# Patient Record
Sex: Female | Born: 1995 | Race: Black or African American | Hispanic: No | Marital: Single | Smoking: Former smoker
Health system: Southern US, Community
[De-identification: ages and names within clinical notes are randomized; demographics above are authoritative.]

## PROBLEM LIST (undated history)

## (undated) ENCOUNTER — Inpatient Hospital Stay (HOSPITAL_COMMUNITY): Payer: Self-pay

## (undated) DIAGNOSIS — K219 Gastro-esophageal reflux disease without esophagitis: Secondary | ICD-10-CM

## (undated) DIAGNOSIS — K819 Cholecystitis, unspecified: Secondary | ICD-10-CM

## (undated) DIAGNOSIS — Z789 Other specified health status: Secondary | ICD-10-CM

## (undated) DIAGNOSIS — G43909 Migraine, unspecified, not intractable, without status migrainosus: Secondary | ICD-10-CM

## (undated) HISTORY — DX: Migraine, unspecified, not intractable, without status migrainosus: G43.909

## (undated) HISTORY — PX: NO PAST SURGERIES: SHX2092

## (undated) HISTORY — DX: Cholecystitis, unspecified: K81.9

## (undated) HISTORY — PX: MOUTH SURGERY: SHX715

## (undated) HISTORY — PX: LAPAROSCOPIC GASTRIC BAND REMOVAL WITH LAPAROSCOPIC GASTRIC SLEEVE RESECTION: SHX6498

---
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
## 2014-05-04 ENCOUNTER — Emergency Department (HOSPITAL_COMMUNITY)
Admission: EM | Admit: 2014-05-04 | Discharge: 2014-05-04 | Disposition: A | Discharge status: Home / Self Care | Payer: 59 | Attending: Emergency Medicine | Admitting: Emergency Medicine

## 2014-05-04 ENCOUNTER — Encounter (HOSPITAL_COMMUNITY): Payer: Self-pay | Admitting: Emergency Medicine

## 2014-05-04 DIAGNOSIS — Z792 Long term (current) use of antibiotics: Secondary | ICD-10-CM | POA: Insufficient documentation

## 2014-05-04 DIAGNOSIS — J020 Streptococcal pharyngitis: Secondary | ICD-10-CM | POA: Insufficient documentation

## 2014-05-04 DIAGNOSIS — J0300 Acute streptococcal tonsillitis, unspecified: Secondary | ICD-10-CM

## 2014-05-04 DIAGNOSIS — H9209 Otalgia, unspecified ear: Secondary | ICD-10-CM | POA: Insufficient documentation

## 2014-05-04 DIAGNOSIS — Z3202 Encounter for pregnancy test, result negative: Secondary | ICD-10-CM | POA: Insufficient documentation

## 2014-05-04 LAB — POC URINE PREG, ED: PREG TEST UR: NEGATIVE

## 2014-05-04 LAB — RAPID STREP SCREEN (MED CTR MEBANE ONLY): STREPTOCOCCUS, GROUP A SCREEN (DIRECT): POSITIVE — AB

## 2014-05-04 MED ORDER — CLINDAMYCIN HCL 150 MG PO CAPS
450.0000 mg | ORAL_CAPSULE | Freq: Once | ORAL | Status: AC
  Administered 2014-05-04: 450 mg via ORAL
  Filled 2014-05-04: qty 3

## 2014-05-04 MED ORDER — ACETAMINOPHEN 325 MG PO TABS
650.0000 mg | ORAL_TABLET | Freq: Once | ORAL | Status: AC
Start: 2014-05-04 — End: 2014-05-04
  Administered 2014-05-04: 650 mg via ORAL
  Filled 2014-05-04: qty 2

## 2014-05-04 MED ORDER — CLINDAMYCIN HCL 150 MG PO CAPS: 450.0000 mg | ORAL_CAPSULE | Freq: Three times a day (TID) | ORAL | Status: DC

## 2014-05-04 NOTE — ED Provider Notes (Signed)
CSN: [id number]     Arrival date & time 05/04/14  [id number] History  This chart was scribed for non-physician practitioner, Raymon MuttonMarissa Veronica Fretz, PA-C,working with Gilda Creasehristopher J. Pollina, MD, by Karle PlumberJennifer Tensley, ED Scribe.  This patient was seen in room TR06C/TR06C and the patient's care was started at 11:23 AM.  Chief Complaint  Patient presents with  . Oral Swelling   The history is provided by the patient. No language interpreter was used.   HPI Comments:  [name] [name] is a 18 y.o. female who presents to the Emergency Department complaining of worsening sore throat with painful swallowing and bilateral tonsillar region pain onset yesterday. She states her throat started out just feeling dry but now feels as if she has something "stuck in it". She reports associated bilateral ear pain with the left being worse. She states she took Advil last night for the pain. She denies sick contacts. Pt denies neck stiffness, neck pain, cough, SOB, CP, fever, chills, hemoptysis, nausea, vomiting, abdominal pain, dental pain, facial swelling, or ear drainage. She is allergic to shellfish but is unsure if it is an allergy to iodine.   History reviewed. No pertinent past medical history. History reviewed. No pertinent past surgical history. History reviewed. No pertinent family history. History  Substance Use Topics  . Smoking status: Never Smoker   . Smokeless tobacco: Never Used  . Alcohol Use: No   OB History   Grav Para Term Preterm Abortions TAB SAB Ect Mult Living                 Review of Systems  Constitutional: Negative for fever and chills.  HENT: Positive for ear pain and sore throat. Negative for dental problem, facial swelling and trouble swallowing.   Respiratory: Negative for cough and shortness of breath.   Cardiovascular: Negative for chest pain.  Gastrointestinal: Negative for nausea, vomiting and abdominal pain.  Musculoskeletal: Negative for neck stiffness.  Allergic/Immunologic:  Positive for food allergies (shellfish).    Allergies  Shellfish allergy  Home Medications   Prior to Admission medications   Medication Sig Start Date End Date Taking? Authorizing Provider  clindamycin (CLEOCIN) 150 MG capsule Take 3 capsules (450 mg total) by mouth 3 (three) times daily. 05/04/14   Anberlin Diez, PA-C   Triage Vitals: BP 126/77  Pulse 105  Temp(Src) 98.7 F (37.1 C) (Oral)  Resp 18  SpO2 97%  LMP 04/04/2014 Physical Exam  Nursing note and vitals reviewed. Constitutional: She is oriented to person, place, and time. She appears well-developed and well-nourished.  HENT:  Head: Normocephalic and atraumatic.  Mouth/Throat: Oropharyngeal exudate present.  Negative facial swelling. Negative swelling to the neck or deformities. Uvula midline with symmetrical elevation. Negative uvula swelling. Bilateral tonsillar adenopathy identified with exudate. Negative petechiae noted to the soft palate. Negative damage noted to dentition. Negative sublingual lesions. Negative trismus.  Eyes: Conjunctivae and EOM are normal. Pupils are equal, round, and reactive to light. Right eye exhibits no discharge. Left eye exhibits no discharge.  Neck: Normal range of motion. Neck supple. No tracheal deviation present.  Negative nuchal rigidity Negative neck stiffness Negative pain upon palpation to the C-spine Negative swelling noted to the neck or deformities Bilateral tonsillar adenopathy identified-soft, mobile  Cardiovascular: Normal rate, regular rhythm and normal heart sounds.  Exam reveals no friction rub.   No murmur heard. Pulmonary/Chest: Effort normal and breath sounds normal. No respiratory distress. She has no wheezes. She has no rales.  Patient is able to speak  in full sentences without difficulty Negative use of accessory muscles Negative stridor Negative active drooling   Musculoskeletal: Normal range of motion. She exhibits no edema and no tenderness.  Full ROM to  upper and lower extremities without difficulty noted, negative ataxia noted.  Lymphadenopathy:    She has cervical adenopathy.  Neurological: She is alert and oriented to person, place, and time. No cranial nerve deficit. She exhibits normal muscle tone. Coordination normal.  Cranial nerves III-XII grossly intact Equal grip strength bilaterally Negative facial drooping Negative slurred speech Negative aphasia Negative arm drift Fine motor skills intact Heel to knee down shin normal bilaterally Gait proper, proper balance - negative sway, negative drift, negative step-offs  Skin: Skin is warm and dry.  Psychiatric: She has a normal mood and affect. Her behavior is normal.    ED Course  Procedures (including critical care time) DIAGNOSTIC STUDIES: Oxygen Saturation is 97% on RA, normal by my interpretation.   COORDINATION OF CARE: 11:30 AM- Will order rapid strep test. Pt verbalizes understanding and agrees to plan.  Medications  clindamycin (CLEOCIN) capsule 450 mg (450 mg Oral Given 05/04/14 1223)  acetaminophen (TYLENOL) tablet 650 mg (650 mg Oral Given 05/04/14 1227)    Labs Review Labs Reviewed  RAPID STREP SCREEN - Abnormal; Notable for the following:    Streptococcus, Group A Screen (Direct) POSITIVE (*)    All other components within normal limits  POC URINE PREG, ED    Imaging Review No results found.   EKG Interpretation None      MDM   Final diagnoses:  Streptococcal tonsillitis    Medications  clindamycin (CLEOCIN) capsule 450 mg (450 mg Oral Given 05/04/14 1223)  acetaminophen (TYLENOL) tablet 650 mg (650 mg Oral Given 05/04/14 1227)    Filed Vitals:   05/04/14 1010 05/04/14 1300  BP: 126/77 110/58  Pulse: 105 88  Temp: 98.7 F (37.1 C)   TempSrc: Oral   Resp: 18 15  SpO2: 97% 100%    I personally performed the services described in this documentation, which was scribed in my presence. The recorded information has been reviewed and is  accurate.  Urine pregnancy negative. Rapid strep positive. Doubt peritonsillar abscess. Doubt retropharyngeal abscess. Suspicion to be staphylococcal pharyngitis/tonsillitis. Patient given clindamycin while in ED setting. Patient stable, afebrile. Patient not septic appearing. Patient able to tolerate fluids by mouth. Discharged patient. Discharged patient with antibiotics. Discussed with patient to rest and stay hydrated. [facility] and [facility]. Discussed with patient to closely monitor symptoms and if symptoms are to worsen or change to report back to the ED - strict return instructions given.  Patient agreed to plan of care, understood, all questions answered.   Raymon Mutton, PA-C 05/04/14 1837  Raymon Mutton, PA-C 05/05/14 [phone]

## 2014-05-04 NOTE — ED Notes (Signed)
Patient states throat feels swollen since yesterday morning. Denies short of breath

## 2014-05-04 NOTE — Discharge Instructions (Signed)
Please call and set up an appointment with your primary care provider as well as ear, nose, throat physician Please take antibiotics as prescribed-while on antibiotics please take on a full stomach Please avoid any physical or strenuous activity Please continue to monitor symptoms closely and if symptoms are to worsen or change (fever greater than 101, chills, chest pain, shortness of breath, difficulty breathing, neck pain, neck stiffness, difficulty swallowing, nausea, vomiting, stomach pain, worsening changes to pain symptoms) please report back to the ED immediately  Tonsillitis Tonsillitis is an infection of the throat that causes the tonsils to become red, tender, and swollen. Tonsils are collections of lymphoid tissue at the back of the throat. Each tonsil has crevices (crypts). Tonsils help fight nose and throat infections and keep infection from spreading to other parts of the body for the first 18 months of life.  CAUSES Sudden (acute) tonsillitis is usually caused by infection with streptococcal bacteria. Long-lasting (chronic) tonsillitis occurs when the crypts of the tonsils become filled with pieces of food and bacteria, which makes it easy for the tonsils to become repeatedly infected. SYMPTOMS  Symptoms of tonsillitis include:  A sore throat, with possible difficulty swallowing.  White patches on the tonsils.  Fever.  Tiredness.  New episodes of snoring during sleep, when you did not snore before.  Small, foul-smelling, yellowish-white pieces of material (tonsilloliths) that you occasionally cough up or spit out. The tonsilloliths can also cause you to have bad breath. DIAGNOSIS Tonsillitis can be diagnosed through a physical exam. Diagnosis can be confirmed with the results of lab tests, including a throat culture. TREATMENT  The goals of tonsillitis treatment include the reduction of the severity and duration of symptoms and prevention of associated conditions. Symptoms of  tonsillitis can be improved with the use of steroids to reduce the swelling. Tonsillitis caused by bacteria can be treated with antibiotic medicines. Usually, treatment with antibiotic medicines is started before the cause of the tonsillitis is known. However, if it is determined that the cause is not bacterial, antibiotic medicines will not treat the tonsillitis. If attacks of tonsillitis are severe and frequent, your health care provider may recommend surgery to remove the tonsils (tonsillectomy). HOME CARE INSTRUCTIONS   Rest as much as possible and get plenty of sleep.  Drink plenty of fluids. While the throat is very sore, eat soft foods or liquids, such as sherbet, soups, or instant breakfast drinks.  Eat frozen ice pops.  Gargle with a warm or cold liquid to help soothe the throat. Mix 1/4 teaspoon of salt and 1/4 teaspoon of baking soda in in 8 oz of water. SEEK MEDICAL CARE IF:   Large, tender lumps develop in your neck.  A rash develops.  A green, yellow-brown, or bloody substance is coughed up.  You are unable to swallow liquids or food for 24 hours.  You notice that only one of the tonsils is swollen. SEEK IMMEDIATE MEDICAL CARE IF:   You develop any new symptoms such as vomiting, severe headache, stiff neck, chest pain, or trouble breathing or swallowing.  You have severe throat pain along with drooling or voice changes.  You have severe pain, unrelieved with recommended medications.  You are unable to fully open the mouth.  You develop redness, swelling, or severe pain anywhere in the neck.  You have a fever. MAKE SURE YOU:   Understand these instructions.  Will watch your condition.  Will get help right away if you are not doing well or  get worse. Document Released: 07/22/2005 Document Revised: 10/17/2013 Document Reviewed: 03/31/2013 [facility] Patient Information 2015 [place], [place]. This information is not intended to replace advice given to you by your  health care provider. Make sure you discuss any questions you have with your health care provider.   Emergency Department Resource Guide 1) Find a Doctor and Pay Out of Pocket Although you won't have to find out who is covered by your insurance plan, it is a good idea to ask around and get recommendations. You will then need to call the office and see if the doctor you have chosen will accept you as a new patient and what types of options they offer for patients who are self-pay. Some doctors offer discounts or will set up payment plans for their patients who do not have insurance, but you will need to ask so you aren't surprised when you get to your appointment.  2) Contact Your Local Health Department Not all health departments have doctors that can see patients for sick visits, but many do, so it is worth a call to see if yours does. If you don't know where your local health department is, you can check in your phone book. The CDC also has a tool to help you locate your state's health department, and many state websites also have listings of all of their local health departments.  3) Find a Walk-in Clinic If your illness is not likely to be very severe or complicated, you may want to try a walk in clinic. These are popping up all over the country in pharmacies, drugstores, and shopping centers. They're usually staffed by nurse practitioners or physician assistants that have been trained to treat common illnesses and complaints. They're usually fairly quick and inexpensive. However, if you have serious medical issues or chronic medical problems, these are probably not your best option.  No Primary Care Doctor: - Call Health Connect at  [phone] - they can help you locate a primary care doctor that  accepts your insurance, provides certain services, etc. - Physician Referral Service- [phone]  Chronic Pain Problems: Organization         Address  Phone   Notes  [facility]  [phone] Patients need to be referred by their primary care doctor.   Medication Assistance: Organization         Address  Phone   Notes  [facility] Medication [facility] [address] [place] [place], [place] [address] [phone] --Must be a resident of [facility] -- Must have NO insurance coverage whatsoever (no Medicaid/ Medicare, etc.) -- The pt. MUST have a primary care doctor that directs their care regularly and follows them in the community   [facility]  [phone]   [employer]  [phone]    Agencies that provide inexpensive medical care: Organization         Address  Phone   Notes  [facility]  [phone]   [facility]    [phone]   [facility] [address] [place], [place] [address] [phone]   [facility] [address] [place] [place] [phone]   Planned Parenthood    [phone]   [facility]    [phone]   [facility]  [address], [place] Phone:  [phone], Fax:  [phone] Hours of Operation:  9 am - 6  pm, M-F.  Also accepts Medicaid/Medicare and self-pay.  [facility]  [address], [place] Phone: [phone], Fax: [phone]. Hours of Operation:  8:30 am - 5:30 pm, M-F.  Also accepts Medicaid and self-pay.  [facility] [place] [address], [place] Phone: [phone]   [facility] [address] Natasha Bence [place], [place] [phone], Ext. [phone] Mondays & Thursdays: 7-9 AM.  First 15 patients are seen on a first come, first serve basis.    Medicaid-accepting [facility] Providers:  Organization         Address  Phone   Notes  [facility] [address], [place] [phone] Also accepts self-pay patients.  [facility] [address] Laurell Josephs [place],  [place]  [phone]   [facility] [address], [place] [phone]   [facility] [address], [place] [phone]   Renaye Rakers [address], [place]   [phone] Only accepts [place] patients after they have their name applied to their card.   Self-Pay (no insurance) in [facility]:  Organization         Address  Phone   Notes  Sickle Cell Patients, [facility] [address] [place], [place] [phone]   [facility] [address] [place], [place] [phone]   [facility] [place]  [address], [place] [phone]   [facility]/Dr. Osei-Bonsu  [address] [place] or [address], [place] [phone] Phone number for both [place] and [place] locations is the same.  [facility] [address], [place] [phone]   [facility] [address] [place] or [address] [phone]   [facility] [address] [place] [phone], phone; [phone], fax Sees patients 1st and 3rd Saturday of every month.  Must not qualify for public or private insurance (i.e. Medicaid, Medicare, [place] Health Choice, Veterans' Benefits)  Household income should be no more than 200% of the poverty level The clinic cannot treat you if you are pregnant or think you are pregnant  Sexually transmitted diseases are not treated at the clinic.    Dental Care: Organization         Address  Phone  Notes  [facility] Department of [facility] [address] [place], [place] [phone] Accepts children up to age 56 who are enrolled in [place] or [place] Health Choice; pregnant women with a Medicaid card; and children who have applied for Medicaid or [place] Health  Choice, but were declined, whose parents can pay a reduced fee at time of service.  [facility] Department of [facility]  [address], [place] [phone] Accepts children up to age 57 who are enrolled in [place] or [place] Health Choice; pregnant women with a Medicaid card; and children who have applied for Medicaid or [place] Health Choice, but were declined, whose parents can pay a reduced fee at time of service.  [facility]  [address] [place], [place] [phone] Patients are seen by appointment only. Walk-ins are not accepted. [facility] will see patients 55 years of age and older.  Monday - Tuesday (8am-5pm) Most Wednesdays (8:30-5pm) $30 per visit, cash only  [facility]  [address], [facility] [place] [phone] Patients are seen by appointment only. Walk-ins are not accepted. [facility] will see patients 20 years of age and older. One Wednesday Evening (Monthly: Volunteer Based).  $30 per visit, cash only  [facility]  [phone] for adults; Children under age 584, call Graduate Pediatric Dentistry at [phone]. Children aged 644-14, please call [phone] to request a pediatric application.  Dental services are provided in all areas of dental care including fillings, crowns and bridges, complete and partial dentures, implants, gum treatment, root canals, and extractions. Preventive care is also provided. Treatment is provided to both adults and children. Patients are selected via a lottery and there is often a waiting list.   [facility] [address], [place]  [phone] [email or web address]   [facility] [address], [place], [place] [phone], Ext. [phone] Second and Fourth Thursday of each month, opens at 6:30 AM; Clinic ends at 9 AM.  Patients are seen on a first-come first-served basis, and a limited number are seen during each  clinic.   [facility]  [address], [place], [place] [phone]   Eligibility Requirements You must have lived in [place], [place], or [place] counties for at least the last three months.   You cannot be eligible for state or federal sponsored National Cityhealthcare insurance, including CIGNAVeterans Administration, [place]icaid, or [employer]icare.   You generally cannot be eligible for healthcare insurance through your employer.    How to apply: Eligibility screenings are held every Tuesday and Wednesday afternoon from 1:00 pm until 4:00 pm. You do not need an appointment for the interview!  [facility] [address], [place], [place] [phone]   [facility]  [phone]   [facility]  [phone]   [facility]  [phone]    Behavioral Health Resources in the Community: Intensive Outpatient Programs Organization         Address  Phone  Notes  [facility] [address], [place], [place] [phone]   [facility] Outpatient [address], [place], [place] [phone]   [facility] [address], [place], [place]  [phone]   [facility] [address],  [place], [place] [phone] or [phone]   Substance Abuse Resources Organization         Address  Phone  Notes  [facility]  [phone]   [facility]  [phone]   [facility]  [phone]   [facility]  [phone]   Residential & Outpatient Substance Abuse Program  [phone]   Psychological Services Organization         Address  Phone  Notes  [facility]  [phone]   [facility]  [phone]- [phone]   [facility] [address], [place] [phone] or [phone]    Mobile Crisis Teams Organization         Address  Phone  Notes  [facility], Mobile  Crisis Care Unit  [phone]   [facility]  [address] [place], [place] [phone]   Doristine LocksSharon DeEsch [address] [place] [phone]    Self-Help/Support Groups Organization         Address  Phone  Notes  [facility] [place] - variety of support groups  336- [phone] Call for more information  Narcotics Anonymous (NA), [facility] [address], [place]  2 meetings at this location   Statisticianesidential Treatment Programs Organization         Address  Phone  Notes  [facility] [address],    [place]  [phone]   [facility]  [address], [place] [phone], [place], [place] [phone]   [facility] [address], [place] [phone] Admissions: 8am-3pm M-F  [facility] [address]    [place], [place] [phone]   [facility] [address], [place], [place] [phone]   [facility] [address]  [place], [place] [phone]   [facility] - Intensive Outpatient [address], [place], [place] [phone]   [facility] ([facility] [address]  [place], [place] [phone] or [phone]   [facility] ([facility]) [address] [place], [place] [phone] Accepts Medicaid  [facility] [address]  [place] [phone] Substance Abuse/Addiction Treatment   [facility] Resources Organization         Address  Phone  Notes  [facility]  [phone]   Angie FavaJulie Brannon, PhD [address], [address] [place], [place]   [phone] or [phone]   [facility]   [address] [place], [place] [phone]) [phone]   [facility] [address], [place], [place] [phone] Insurance/Medicaid/sponsorship through [facility]  [facility] Families [address]                                    [place], [place] [phone] Therapy/tele-psych/case  [facility] [address]   [place], [place] [phone]    Dr. Lolly MustacheArfeen  [phone]   [facility]  [employer] [facility] 1) [address], [place] 2) [address], [place] 3)  [address], [place] [phone]  [phone]   [facility] Child Abuse Hotline [phone](336) 2143939435 or [phone]) [phone] (After Hours)

## 2014-05-07 NOTE — ED Provider Notes (Signed)
Medical screening examination/treatment/procedure(s) were performed by non-physician practitioner and as supervising physician I was immediately available for consultation/collaboration.   EKG Interpretation None        Gilda Creasehristopher J. Jarmon Javid, MD 05/07/14 [phone]

## 2015-02-07 LAB — OB RESULTS CONSOLE HEPATITIS B SURFACE ANTIGEN: HEP B S AG: NEGATIVE

## 2015-02-07 LAB — OB RESULTS CONSOLE RUBELLA ANTIBODY, IGM: RUBELLA: NON-IMMUNE/NOT IMMUNE

## 2015-02-07 LAB — OB RESULTS CONSOLE ANTIBODY SCREEN: ANTIBODY SCREEN: NEGATIVE

## 2015-02-07 LAB — OB RESULTS CONSOLE ABO/RH: RH TYPE: POSITIVE

## 2015-02-07 LAB — OB RESULTS CONSOLE HIV ANTIBODY (ROUTINE TESTING): HIV: NONREACTIVE

## 2015-02-07 LAB — OB RESULTS CONSOLE RPR: RPR: NONREACTIVE

## 2015-02-20 LAB — OB RESULTS CONSOLE GC/CHLAMYDIA
Chlamydia: NEGATIVE
Gonorrhea: NEGATIVE

## 2015-03-27 ENCOUNTER — Encounter (HOSPITAL_COMMUNITY): Payer: Self-pay

## 2015-03-27 ENCOUNTER — Inpatient Hospital Stay (HOSPITAL_COMMUNITY)
Admission: AD | Admit: 2015-03-27 | Discharge: 2015-03-27 | Disposition: A | Discharge status: Home / Self Care | Payer: 59 | Source: Ambulatory Visit | Attending: Obstetrics and Gynecology | Admitting: Obstetrics and Gynecology

## 2015-03-27 DIAGNOSIS — Z3A15 15 weeks gestation of pregnancy: Secondary | ICD-10-CM | POA: Insufficient documentation

## 2015-03-27 DIAGNOSIS — R51 Headache: Secondary | ICD-10-CM | POA: Insufficient documentation

## 2015-03-27 DIAGNOSIS — O26892 Other specified pregnancy related conditions, second trimester: Secondary | ICD-10-CM | POA: Diagnosis not present

## 2015-03-27 DIAGNOSIS — O2342 Unspecified infection of urinary tract in pregnancy, second trimester: Secondary | ICD-10-CM | POA: Insufficient documentation

## 2015-03-27 HISTORY — DX: Other specified health status: Z78.9

## 2015-03-27 LAB — URINALYSIS, ROUTINE W REFLEX MICROSCOPIC
Bilirubin Urine: NEGATIVE
Glucose, UA: NEGATIVE mg/dL
HGB URINE DIPSTICK: NEGATIVE
NITRITE: NEGATIVE
PROTEIN: NEGATIVE mg/dL
SPECIFIC GRAVITY, URINE: 1.02 (ref 1.005–1.030)
UROBILINOGEN UA: 0.2 mg/dL (ref 0.0–1.0)
pH: 6.5 (ref 5.0–8.0)

## 2015-03-27 LAB — URINE MICROSCOPIC-ADD ON

## 2015-03-27 MED ORDER — BUTALBITAL-APAP-CAFFEINE 50-325-40 MG PO TABS
2.0000 | ORAL_TABLET | Freq: Once | ORAL | Status: AC
  Administered 2015-03-27: 2 via ORAL
  Filled 2015-03-27: qty 2

## 2015-03-27 MED ORDER — NITROFURANTOIN MONOHYD MACRO 100 MG PO CAPS: 100.0000 mg | ORAL_CAPSULE | Freq: Two times a day (BID) | ORAL | Status: DC

## 2015-03-27 NOTE — MAU Note (Signed)
Pt presents complaining of a headache since Sunday. Rates it a 7/10. Took tylenol Monday but it didn't help. Pt also complaining of nausea. Vomited twice today. Able to keep fluids down. Denies vaginal bleeding or discharge.

## 2015-03-27 NOTE — MAU Provider Note (Signed)
History     CSN: [id number]  Arrival date and time: 03/27/15 1622   First Provider Initiated Contact with Patient 03/27/15 1717      Chief Complaint  Patient presents with  . Headache   HPI   Ms.[name] [name] is a 19 y.o. female G1P0 at 6662w4d who presents with a HA that started 3 days ago. The HA is located all over her head with photophobia. She does not have a history of migraines or headaches. She currently rates her pain 7/10, she has not had anything to eat or drink today. She had 2 episodes of vomiting this morning, which is not uncommon for her. After she vomited this morning she just did not feel like eating or drinking.   She took tylenol on Tuesday; She took one pill and this did not help. She has not taken anything since.   OB History    Gravida Para Term Preterm AB TAB SAB Ectopic Multiple Living   1               Past Medical History  Diagnosis Date  . Medical history non-contributory     Past Surgical History  Procedure Laterality Date  . No past surgeries      No family history on file.  History  Substance Use Topics  . Smoking status: Never Smoker   . Smokeless tobacco: Never Used  . Alcohol Use: No    Allergies:  Allergies  Allergen Reactions  . Shellfish Allergy Hives    Prescriptions prior to admission  Medication Sig Dispense Refill Last Dose  . clindamycin (CLEOCIN) 150 MG capsule Take 3 capsules (450 mg total) by mouth 3 (three) times daily. 90 capsule 0    Results for orders placed or performed during the hospital encounter of 03/27/15 (from the past 24 hour(s))  Urinalysis, Routine w reflex microscopic (not at [facility])     Status: Abnormal   Collection Time: 03/27/15  5:05 PM  Result Value Ref Range   Color, Urine YELLOW YELLOW   APPearance HAZY (A) CLEAR   Specific Gravity, Urine 1.020 1.005 - 1.030   pH 6.5 5.0 - 8.0   Glucose, UA NEGATIVE NEGATIVE mg/dL   Hgb urine dipstick NEGATIVE NEGATIVE   Bilirubin Urine NEGATIVE  NEGATIVE   Ketones, ur >80 (A) NEGATIVE mg/dL   Protein, ur NEGATIVE NEGATIVE mg/dL   Urobilinogen, UA 0.2 0.0 - 1.0 mg/dL   Nitrite NEGATIVE NEGATIVE   Leukocytes, UA LARGE (A) NEGATIVE  Urine microscopic-add on     Status: Abnormal   Collection Time: 03/27/15  5:05 PM  Result Value Ref Range   Squamous Epithelial / LPF FEW (A) RARE   WBC, UA 11-20 <3 WBC/hpf     Review of Systems  Eyes: Positive for photophobia.  Gastrointestinal: Negative for nausea and vomiting.  Neurological: Positive for headaches.   Physical Exam   Blood pressure 114/70, pulse 95, temperature 98 F (36.7 C), temperature source Oral, resp. rate 18, height 5\' 1"  (1.549 m), weight 101.515 kg (223 lb 12.8 oz), last menstrual period 04/04/2014.  Physical Exam  Constitutional: She is oriented to person, place, and time. She appears well-developed and well-nourished.  HENT:  Head: Normocephalic.  Eyes: Pupils are equal, round, and reactive to light.  Neck: Neck supple.  Musculoskeletal: Normal range of motion.  Neurological: She is alert and oriented to person, place, and time. She has normal strength. GCS eye subscore is 4. GCS verbal subscore is 5. GCS motor  subscore is 6.  Skin: Skin is warm.  Psychiatric: Her behavior is normal.    MAU Course  Procedures  None   MDM   Fioricet 2 tabs given PO Patient drank a pitch of water and ate a sand which while here Patient rates her pain 0/10; down from 7/10 Urine culture pending  Discussed HPI, labs and treatment plan with Dr. Arelia Sneddon  + fetal heart tones via doppler  Assessment and Plan   A:  1. Headache in pregnancy, second trimester   2. UTI in pregnancy, second trimester    P:  Discharge home in stable condition RX: Macrobid Increase water intake 8-10 glasses of water per day Small, frequent meals throughout the day Follow up with MD as scheduled     Duane Lope, NP 03/27/2015 5:23 PM

## 2015-03-27 NOTE — Progress Notes (Signed)
Took patient [name] [name] and peanut butter, sprite and water.  Instructed to eat a little bit prior to medication administration because if given on an empty stomach could make her nauseous.  Told her I have ordered a sandwich tray.

## 2015-03-28 LAB — URINE CULTURE
Colony Count: 25000
Special Requests: NORMAL

## 2015-05-20 ENCOUNTER — Encounter (HOSPITAL_COMMUNITY): Payer: Self-pay

## 2015-05-20 ENCOUNTER — Inpatient Hospital Stay (HOSPITAL_COMMUNITY)
Admission: AD | Admit: 2015-05-20 | Discharge: 2015-05-20 | Disposition: A | Discharge status: Home / Self Care | Payer: 59 | Source: Ambulatory Visit | Attending: Obstetrics & Gynecology | Admitting: Obstetrics & Gynecology

## 2015-05-20 DIAGNOSIS — O26891 Other specified pregnancy related conditions, first trimester: Secondary | ICD-10-CM

## 2015-05-20 DIAGNOSIS — Z3A23 23 weeks gestation of pregnancy: Secondary | ICD-10-CM | POA: Diagnosis not present

## 2015-05-20 DIAGNOSIS — O9989 Other specified diseases and conditions complicating pregnancy, childbirth and the puerperium: Secondary | ICD-10-CM | POA: Insufficient documentation

## 2015-05-20 DIAGNOSIS — R51 Headache: Secondary | ICD-10-CM | POA: Insufficient documentation

## 2015-05-20 DIAGNOSIS — R42 Dizziness and giddiness: Secondary | ICD-10-CM | POA: Diagnosis present

## 2015-05-20 LAB — COMPREHENSIVE METABOLIC PANEL
ALBUMIN: 3.1 g/dL — AB (ref 3.5–5.0)
ALT: 12 U/L — ABNORMAL LOW (ref 14–54)
ANION GAP: 3 — AB (ref 5–15)
AST: 13 U/L — ABNORMAL LOW (ref 15–41)
Alkaline Phosphatase: 76 U/L (ref 38–126)
BUN: 8 mg/dL (ref 6–20)
CALCIUM: 8.5 mg/dL — AB (ref 8.9–10.3)
CO2: 24 mmol/L (ref 22–32)
CREATININE: 0.7 mg/dL (ref 0.44–1.00)
Chloride: 108 mmol/L (ref 101–111)
GLUCOSE: 77 mg/dL (ref 65–99)
Potassium: 3.8 mmol/L (ref 3.5–5.1)
SODIUM: 135 mmol/L (ref 135–145)
TOTAL PROTEIN: 6.8 g/dL (ref 6.5–8.1)
Total Bilirubin: 0.6 mg/dL (ref 0.3–1.2)

## 2015-05-20 LAB — URINALYSIS, ROUTINE W REFLEX MICROSCOPIC
Bilirubin Urine: NEGATIVE
Glucose, UA: NEGATIVE mg/dL
Hgb urine dipstick: NEGATIVE
Ketones, ur: NEGATIVE mg/dL
Leukocytes, UA: NEGATIVE
Nitrite: NEGATIVE
PH: 7 (ref 5.0–8.0)
PROTEIN: NEGATIVE mg/dL
Specific Gravity, Urine: 1.025 (ref 1.005–1.030)
UROBILINOGEN UA: 0.2 mg/dL (ref 0.0–1.0)

## 2015-05-20 LAB — CBC
HCT: 31.6 % — ABNORMAL LOW (ref 36.0–46.0)
HEMOGLOBIN: 10.6 g/dL — AB (ref 12.0–15.0)
MCH: 30 pg (ref 26.0–34.0)
MCHC: 33.5 g/dL (ref 30.0–36.0)
MCV: 89.5 fL (ref 78.0–100.0)
Platelets: 247 10*3/uL (ref 150–400)
RBC: 3.53 MIL/uL — ABNORMAL LOW (ref 3.87–5.11)
RDW: 14.3 % (ref 11.5–15.5)
WBC: 10.1 10*3/uL (ref 4.0–10.5)

## 2015-05-20 MED ORDER — BUTALBITAL-APAP-CAFFEINE 50-325-40 MG PO TABS
2.0000 | ORAL_TABLET | Freq: Once | ORAL | Status: AC
  Administered 2015-05-20: 2 via ORAL
  Filled 2015-05-20: qty 2

## 2015-05-20 NOTE — MAU Note (Signed)
Patient reports dizziness x 2 days, headache started today, legs aching, having more than usual bowel movements 3 to 4 daily soft but not loose.

## 2015-05-20 NOTE — Discharge Instructions (Signed)
General Headache Without Cause A headache is pain or discomfort felt around the head or neck area. The specific cause of a headache may not be found. There are many causes and types of headaches. A few common ones are:  Tension headaches.  Migraine headaches.  Cluster headaches.  Chronic daily headaches. HOME CARE INSTRUCTIONS   Keep all follow-up appointments with your caregiver or any specialist referral.  Only take over-the-counter or prescription medicines for pain or discomfort as directed by your caregiver.  Lie down in a dark, quiet room when you have a headache.  Keep a headache journal to find out what may trigger your migraine headaches. For example, write down:  What you eat and drink.  How much sleep you get.  Any change to your diet or medicines.  Try massage or other relaxation techniques.  Put ice packs or heat on the head and neck. Use these 3 to 4 times per day for 15 to 20 minutes each time, or as needed.  Limit stress.  Sit up straight, and do not tense your muscles.  Quit smoking if you smoke.  Limit alcohol use.  Decrease the amount of caffeine you drink, or stop drinking caffeine.  Eat and sleep on a regular schedule.  Get 7 to 9 hours of sleep, or as recommended by your caregiver.  Keep lights dim if bright lights bother you and make your headaches worse. SEEK MEDICAL CARE IF:   You have problems with the medicines you were prescribed.  Your medicines are not working.  You have a change from the usual headache.  You have nausea or vomiting. SEEK IMMEDIATE MEDICAL CARE IF:   Your headache becomes severe.  You have a fever.  You have a stiff neck.  You have loss of vision.  You have muscular weakness or loss of muscle control.  You start losing your balance or have trouble walking.  You feel faint or pass out.  You have severe symptoms that are different from your first symptoms. MAKE SURE YOU:   Understand these  instructions.  Will watch your condition.  Will get help right away if you are not doing well or get worse. Document Released: 10/12/2005 Document Revised: 01/04/2012 Document Reviewed: 10/28/2011 [facility] Patient Information 2015 [employer], [place]. This information is not intended to replace advice given to you by your health care provider. Make sure you discuss any questions you have with your health care provider.  Second Trimester of Pregnancy The second trimester is from week 13 through week 28, months 4 through 6. The second trimester is often a time when you feel your best. Your body has also adjusted to being pregnant, and you begin to feel better physically. Usually, morning sickness has lessened or quit completely, you may have more energy, and you may have an increase in appetite. The second trimester is also a time when the fetus is growing rapidly. At the end of the sixth month, the fetus is about 9 inches long and weighs about 1 pounds. You will likely begin to feel the baby move (quickening) between 18 and 20 weeks of the pregnancy. BODY CHANGES Your body goes through many changes during pregnancy. The changes vary from woman to woman.   Your weight will continue to increase. You will notice your lower abdomen bulging out.  You may begin to get stretch marks on your hips, abdomen, and breasts.  You may develop headaches that can be relieved by medicines approved by your health care provider.  You may urinate more often because the fetus is pressing on your bladder.  You may develop or continue to have heartburn as a result of your pregnancy.  You may develop constipation because certain hormones are causing the muscles that push waste through your intestines to slow down.  You may develop hemorrhoids or swollen, bulging veins (varicose veins).  You may have back pain because of the weight gain and pregnancy hormones relaxing your joints between the bones in your pelvis and as  a result of a shift in weight and the muscles that support your balance.  Your breasts will continue to grow and be tender.  Your gums may bleed and may be sensitive to brushing and flossing.  Dark spots or blotches (chloasma, mask of pregnancy) may develop on your face. This will likely fade after the baby is born.  A dark line from your belly button to the pubic area (linea nigra) may appear. This will likely fade after the baby is born.  You may have changes in your hair. These can include thickening of your hair, rapid growth, and changes in texture. Some women also have hair loss during or after pregnancy, or hair that feels dry or thin. Your hair will most likely return to normal after your baby is born. WHAT TO EXPECT AT YOUR PRENATAL VISITS During a routine prenatal visit:  You will be weighed to make sure you and the fetus are growing normally.  Your blood pressure will be taken.  Your abdomen will be measured to track your baby's growth.  The fetal heartbeat will be listened to.  Any test results from the previous visit will be discussed. Your health care provider may ask you:  How you are feeling.  If you are feeling the baby move.  If you have had any abnormal symptoms, such as leaking fluid, bleeding, severe headaches, or abdominal cramping.  If you have any questions. Other tests that may be performed during your second trimester include:  Blood tests that check for:  Low iron levels (anemia).  Gestational diabetes (between 24 and 28 weeks).  Rh antibodies.  Urine tests to check for infections, diabetes, or protein in the urine.  An ultrasound to confirm the proper growth and development of the baby.  An amniocentesis to check for possible genetic problems.  Fetal screens for spina bifida and Down syndrome. HOME CARE INSTRUCTIONS   Avoid all smoking, herbs, alcohol, and unprescribed drugs. These chemicals affect the formation and growth of the  baby.  Follow your health care provider's instructions regarding medicine use. There are medicines that are either safe or unsafe to take during pregnancy.  Exercise only as directed by your health care provider. Experiencing uterine cramps is a good sign to stop exercising.  Continue to eat regular, healthy meals.  Wear a good support bra for breast tenderness.  Do not use hot tubs, steam rooms, or saunas.  Wear your seat belt at all times when driving.  Avoid raw meat, uncooked cheese, cat litter boxes, and soil used by cats. These carry germs that can cause birth defects in the baby.  Take your prenatal vitamins.  Try taking a stool softener (if your health care provider approves) if you develop constipation. Eat more high-fiber foods, such as fresh vegetables or fruit and whole grains. Drink plenty of fluids to keep your urine clear or pale yellow.  Take warm sitz baths to soothe any pain or discomfort caused by hemorrhoids. Use hemorrhoid cream if your  health care provider approves.  If you develop varicose veins, wear support hose. Elevate your feet for 15 minutes, 3-4 times a day. Limit salt in your diet.  Avoid heavy lifting, wear low heel shoes, and practice good posture.  Rest with your legs elevated if you have leg cramps or low back pain.  Visit your dentist if you have not gone yet during your pregnancy. Use a soft toothbrush to brush your teeth and be gentle when you floss.  A sexual relationship may be continued unless your health care provider directs you otherwise.  Continue to go to all your prenatal visits as directed by your health care provider. SEEK MEDICAL CARE IF:   You have dizziness.  You have mild pelvic cramps, pelvic pressure, or nagging pain in the abdominal area.  You have persistent nausea, vomiting, or diarrhea.  You have a bad smelling vaginal discharge.  You have pain with urination. SEEK IMMEDIATE MEDICAL CARE IF:   You have a  fever.  You are leaking fluid from your vagina.  You have spotting or bleeding from your vagina.  You have severe abdominal cramping or pain.  You have rapid weight gain or loss.  You have shortness of breath with chest pain.  You notice sudden or extreme swelling of your face, hands, ankles, feet, or legs.  You have not felt your baby move in over an hour.  You have severe headaches that do not go away with medicine.  You have vision changes. Document Released: 10/06/2001 Document Revised: 10/17/2013 Document Reviewed: 12/13/2012 [facility] Patient Information 2015 [place], [place]. This information is not intended to replace advice given to you by your health care provider. Make sure you discuss any questions you have with your health care provider.

## 2015-05-20 NOTE — MAU Provider Note (Signed)
Chief Complaint:  Dizziness and Headache   First Provider Initiated Contact with Patient 05/20/15 1702      HPI: [name] [name] is a 19 y.o. G1P0 at [redacted]w[redacted]d who presents to maternity admissions reporting she went to her OB appointment this morning, then went to work and started having a severe headache and had to leave.   She also reports dizziness starting with the h/a.  She is concerned she could be dehydrated because she was treated for this in early pregnancy.  She reports good fetal movement, denies abdominal pain, LOF, vaginal bleeding, vaginal itching/burning, urinary symptoms, h/a, n/v, or fever/chills.    Dizziness This is a new problem. The current episode started today. The problem occurs intermittently. The problem has been gradually improving. Associated symptoms include headaches. Pertinent negatives include no abdominal pain, chest pain, chills, coughing, fever, nausea, urinary symptoms or vomiting. She has tried nothing for the symptoms.  Headache  This is a new problem. The current episode started today. The problem occurs constantly. The problem has been unchanged. The pain is located in the frontal region. The pain does not radiate. The pain quality is similar to prior headaches. The quality of the pain is described as aching and dull. The pain is severe. Associated symptoms include dizziness. Pertinent negatives include no abdominal pain, blurred vision, coughing, fever, nausea, photophobia, sinus pressure, tinnitus or vomiting. She has tried nothing for the symptoms.    Past Medical History: Past Medical History  Diagnosis Date  . Medical history non-contributory     Past obstetric history: OB History  Gravida Para Term Preterm AB SAB TAB Ectopic Multiple Living  1             # Outcome Date GA Lbr Len/2nd Weight Sex Delivery Anes PTL Lv  1 Current               Past Surgical History: Past Surgical History  Procedure Laterality Date  . No past surgeries       Family History: History reviewed. No pertinent family history.  Social History: History  Substance Use Topics  . Smoking status: Never Smoker   . Smokeless tobacco: Never Used  . Alcohol Use: No    Allergies:  Allergies  Allergen Reactions  . Shellfish Allergy Hives    Meds:  No prescriptions prior to admission    Review of Systems  Constitutional: Negative for fever, chills and malaise/fatigue.  HENT: Negative for sinus pressure and tinnitus.   Eyes: Negative for blurred vision and photophobia.  Respiratory: Negative for cough and shortness of breath.   Cardiovascular: Negative for chest pain.  Gastrointestinal: Negative for heartburn, nausea, vomiting and abdominal pain.  Genitourinary: Negative for dysuria, urgency and frequency.  Musculoskeletal: Negative.   Neurological: Positive for dizziness and headaches.  Psychiatric/Behavioral: Negative for depression.    Physical Exam  Blood pressure 110/50, pulse 121, temperature 98.2 F (36.8 C), temperature source Oral, resp. rate 16, height 5' 0.5" (1.537 m), weight 107.956 kg (238 lb), last menstrual period 04/04/2014. GENERAL: Well-developed, well-nourished female in no acute distress.  EYES: normal sclera/conjunctiva; no lid-lag, PERRLA HENT: Atraumatic, normocephalic HEART: normal rate RESP: normal effort ABDOMEN: Soft, non-tender MUSCULOSKELETAL: Normal ROM EXTREMITIES: Nontender, no edema Neurological - alert, oriented, normal speech, no focal findings or movement disorder noted, screening mental status exam normal, cranial nerves II through XII intact, DTR's normal and symmetric  GU: PELVIC EXAM: Deferred     FHT:  Baseline 145 , moderate variability, accelerations present,  no decelerations Contractions: None on toco or to palpation   Labs: Results for orders placed or performed during the hospital encounter of 05/20/15 (from the past 24 hour(s))  Urinalysis, Routine w reflex microscopic (not at  [facility])     Status: None   Collection Time: 05/20/15  3:30 PM  Result Value Ref Range   Color, Urine YELLOW YELLOW   APPearance CLEAR CLEAR   Specific Gravity, Urine 1.025 1.005 - 1.030   pH 7.0 5.0 - 8.0   Glucose, UA NEGATIVE NEGATIVE mg/dL   Hgb urine dipstick NEGATIVE NEGATIVE   Bilirubin Urine NEGATIVE NEGATIVE   Ketones, ur NEGATIVE NEGATIVE mg/dL   Protein, ur NEGATIVE NEGATIVE mg/dL   Urobilinogen, UA 0.2 0.0 - 1.0 mg/dL   Nitrite NEGATIVE NEGATIVE   Leukocytes, UA NEGATIVE NEGATIVE  CBC     Status: Abnormal   Collection Time: 05/20/15  4:33 PM  Result Value Ref Range   WBC 10.1 4.0 - 10.5 K/uL   RBC 3.53 (L) 3.87 - 5.11 MIL/uL   Hemoglobin 10.6 (L) 12.0 - 15.0 g/dL   HCT 16.1 (L) 09.6 - 04.5 %   MCV 89.5 78.0 - 100.0 fL   MCH 30.0 26.0 - 34.0 pg   MCHC 33.5 30.0 - 36.0 g/dL   RDW 40.9 81.1 - 91.4 %   Platelets 247 150 - 400 K/uL  Comprehensive metabolic panel     Status: Abnormal   Collection Time: 05/20/15  4:33 PM  Result Value Ref Range   Sodium 135 135 - 145 mmol/L   Potassium 3.8 3.5 - 5.1 mmol/L   Chloride 108 101 - 111 mmol/L   CO2 24 22 - 32 mmol/L   Glucose, Bld 77 65 - 99 mg/dL   BUN 8 6 - 20 mg/dL   Creatinine, Ser 7.82 0.44 - 1.00 mg/dL   Calcium 8.5 (L) 8.9 - 10.3 mg/dL   Total Protein 6.8 6.5 - 8.1 g/dL   Albumin 3.1 (L) 3.5 - 5.0 g/dL   AST 13 (L) 15 - 41 U/L   ALT 12 (L) 14 - 54 U/L   Alkaline Phosphatase 76 38 - 126 U/L   Total Bilirubin 0.6 0.3 - 1.2 mg/dL   GFR calc non Af Amer >60 >60 mL/min   GFR calc Af Amer >60 >60 mL/min   Anion gap 3 (L) 5 - 15   ED Course CBC, CMP, U/A ordered and reviewed.  Fioricet x 2 tabs given in MAU with complete relief of symptoms reported by the pt.   Assessment: 1. Headache in pregnancy, antepartum, first trimester     Plan: Discharge home Tylenol for h/a PRN Increase PO fluids       Follow-up Information    Follow up with MORRIS, MEGAN, DO.   Specialty:  Obstetrics and Gynecology   Why:  As  scheduled   Contact information:   [address] [name] [address] [phone]       Follow up with THE [facility]ERN[facility] ADMISSIONS.   Why:  As needed for emergencies   Contact information:   [address] [id number] mc [place] [address] [phone]       Medication List    Notice    You have not been prescribed any medications.      Sharen Counter Certified Nurse-Midwife 05/20/2015 5:53 PM

## 2015-06-01 ENCOUNTER — Encounter (HOSPITAL_COMMUNITY): Payer: Self-pay | Admitting: *Deleted

## 2015-06-01 ENCOUNTER — Inpatient Hospital Stay (HOSPITAL_COMMUNITY): Payer: 59

## 2015-06-01 ENCOUNTER — Inpatient Hospital Stay (HOSPITAL_COMMUNITY)
Admission: AD | Admit: 2015-06-01 | Discharge: 2015-06-01 | Disposition: A | Discharge status: Home / Self Care | Payer: 59 | Source: Ambulatory Visit | Attending: Obstetrics and Gynecology | Admitting: Obstetrics and Gynecology

## 2015-06-01 DIAGNOSIS — O26612 Liver and biliary tract disorders in pregnancy, second trimester: Secondary | ICD-10-CM

## 2015-06-01 DIAGNOSIS — K8020 Calculus of gallbladder without cholecystitis without obstruction: Secondary | ICD-10-CM

## 2015-06-01 DIAGNOSIS — K219 Gastro-esophageal reflux disease without esophagitis: Secondary | ICD-10-CM | POA: Diagnosis not present

## 2015-06-01 DIAGNOSIS — R1010 Upper abdominal pain, unspecified: Secondary | ICD-10-CM | POA: Diagnosis present

## 2015-06-01 DIAGNOSIS — O99612 Diseases of the digestive system complicating pregnancy, second trimester: Secondary | ICD-10-CM | POA: Diagnosis not present

## 2015-06-01 DIAGNOSIS — Z3A25 25 weeks gestation of pregnancy: Secondary | ICD-10-CM | POA: Insufficient documentation

## 2015-06-01 DIAGNOSIS — R109 Unspecified abdominal pain: Secondary | ICD-10-CM

## 2015-06-01 DIAGNOSIS — O26899 Other specified pregnancy related conditions, unspecified trimester: Secondary | ICD-10-CM

## 2015-06-01 LAB — CBC
HEMATOCRIT: 31.3 % — AB (ref 36.0–46.0)
HEMOGLOBIN: 10.7 g/dL — AB (ref 12.0–15.0)
MCH: 30.7 pg (ref 26.0–34.0)
MCHC: 34.2 g/dL (ref 30.0–36.0)
MCV: 89.7 fL (ref 78.0–100.0)
Platelets: 236 10*3/uL (ref 150–400)
RBC: 3.49 MIL/uL — ABNORMAL LOW (ref 3.87–5.11)
RDW: 14 % (ref 11.5–15.5)
WBC: 12.9 10*3/uL — AB (ref 4.0–10.5)

## 2015-06-01 LAB — COMPREHENSIVE METABOLIC PANEL
ALK PHOS: 86 U/L (ref 38–126)
ALT: 12 U/L — ABNORMAL LOW (ref 14–54)
ANION GAP: 7 (ref 5–15)
AST: 15 U/L (ref 15–41)
Albumin: 3.1 g/dL — ABNORMAL LOW (ref 3.5–5.0)
BUN: 8 mg/dL (ref 6–20)
CO2: 24 mmol/L (ref 22–32)
CREATININE: 0.6 mg/dL (ref 0.44–1.00)
Calcium: 8.9 mg/dL (ref 8.9–10.3)
Chloride: 104 mmol/L (ref 101–111)
GLUCOSE: 126 mg/dL — AB (ref 65–99)
Potassium: 4.2 mmol/L (ref 3.5–5.1)
SODIUM: 135 mmol/L (ref 135–145)
Total Bilirubin: 0.7 mg/dL (ref 0.3–1.2)
Total Protein: 6.9 g/dL (ref 6.5–8.1)

## 2015-06-01 LAB — AMYLASE: Amylase: 42 U/L (ref 28–100)

## 2015-06-01 LAB — LIPASE, BLOOD: LIPASE: 11 U/L — AB (ref 22–51)

## 2015-06-01 MED ORDER — GI COCKTAIL ~~LOC~~: 30.0000 mL | ORAL | Status: DC

## 2015-06-01 MED ORDER — RANITIDINE HCL 150 MG PO TABS: 150.0000 mg | ORAL_TABLET | Freq: Two times a day (BID) | ORAL | Status: DC

## 2015-06-01 MED ORDER — GI COCKTAIL ~~LOC~~
30.0000 mL | Freq: Once | ORAL | Status: AC
  Administered 2015-06-01: 30 mL via ORAL
  Filled 2015-06-01: qty 30

## 2015-06-01 NOTE — Progress Notes (Signed)
Pt states feels a lot better after GI cocktail and pain is gone.

## 2015-06-01 NOTE — Progress Notes (Signed)
Sharen Counter CNM notified of pt's admission and status. Will see pt

## 2015-06-01 NOTE — MAU Provider Note (Signed)
Chief Complaint: Abdominal Pain   None     SUBJECTIVE HPI: [name] [name] is a 19 y.o. G1P0 at [redacted]w[redacted]d by LMP who presents to maternity admissions reporting upper abdominal pain described as sharp and constant starting around midnight.  She reports eating a hamburger and fries at 6 pm. She has only had water to drink and no other food or liquids since then.   She denies lower abdominal pain, vaginal bleeding, vaginal itching/burning, urinary symptoms, h/a, dizziness, n/v, or fever/chills.     Abdominal Pain This is a new problem. The current episode started today. The onset quality is sudden. The problem occurs constantly. The problem has been unchanged. The pain is located in the RUQ and LUQ. The pain is severe. The quality of the pain is sharp. The abdominal pain does not radiate. Pertinent negatives include no constipation, diarrhea, dysuria, fever, frequency, headaches, nausea or vomiting. Nothing aggravates the pain. She has tried nothing for the symptoms.    Past Medical History  Diagnosis Date  . Medical history non-contributory    Past Surgical History  Procedure Laterality Date  . No past surgeries     History   Social History  . Marital Status: Single    Spouse Name: N/A  . Number of Children: N/A  . Years of Education: N/A   Occupational History  . Not on file.   Social History Main Topics  . Smoking status: Never Smoker   . Smokeless tobacco: Never Used  . Alcohol Use: No  . Drug Use: No  . Sexual Activity: Yes   Other Topics Concern  . Not on file   Social History Narrative   No current facility-administered medications on file prior to encounter.   No current outpatient prescriptions on file prior to encounter.   Allergies  Allergen Reactions  . Shellfish Allergy Hives    Review of Systems  Constitutional: Negative for fever, chills and malaise/fatigue.  Eyes: Negative for blurred vision.  Respiratory: Negative for cough and shortness of breath.    Cardiovascular: Negative for chest pain.  Gastrointestinal: Positive for abdominal pain. Negative for heartburn, nausea, vomiting, diarrhea and constipation.  Genitourinary: Negative for dysuria, urgency and frequency.  Musculoskeletal: Negative.   Neurological: Negative for dizziness and headaches.  Psychiatric/Behavioral: Negative for depression.    OBJECTIVE Blood pressure 115/75, pulse 105, temperature 98.1 F (36.7 C), resp. rate 18, height 5' 0.5" (1.537 m), weight 110.587 kg (243 lb 12.8 oz), last menstrual period 04/04/2014. GENERAL: Well-developed, well-nourished female in no acute distress.  EYES: normal sclera/conjunctiva; no lid-lag HENT: Atraumatic, normocephalic HEART: normal rate RESP: normal effort ABDOMEN: Soft, tenderness in epigastric region, no lower abdominal tenderness, no rebound, no guarding MUSCULOSKELETAL: Normal ROM EXTREMITIES: Nontender, no edema NEURO/PSYCH: Alert and oriented, appropriate affect GU: PELVIC EXAM: Deferred  FHT 156 by doppler  FHR baseline 145 with moderate variability, positive 10x10 accels, no decels Toco with no contractions, none to palpation  LAB RESULTS Results for orders placed or performed during the hospital encounter of 06/01/15 (from the past 24 hour(s))  CBC     Status: Abnormal   Collection Time: 06/01/15  5:02 AM  Result Value Ref Range   WBC 12.9 (H) 4.0 - 10.5 K/uL   RBC 3.49 (L) 3.87 - 5.11 MIL/uL   Hemoglobin 10.7 (L) 12.0 - 15.0 g/dL   HCT 16.1 (L) 09.6 - 04.5 %   MCV 89.7 78.0 - 100.0 fL   MCH 30.7 26.0 - 34.0 pg   MCHC  34.2 30.0 - 36.0 g/dL   RDW 14.7 82.9 - 56.2 %   Platelets 236 150 - 400 K/uL  Comprehensive metabolic panel     Status: Abnormal   Collection Time: 06/01/15  5:02 AM  Result Value Ref Range   Sodium 135 135 - 145 mmol/L   Potassium 4.2 3.5 - 5.1 mmol/L   Chloride 104 101 - 111 mmol/L   CO2 24 22 - 32 mmol/L   Glucose, Bld 126 (H) 65 - 99 mg/dL   BUN 8 6 - 20 mg/dL   Creatinine, Ser  1.30 0.44 - 1.00 mg/dL   Calcium 8.9 8.9 - 86.5 mg/dL   Total Protein 6.9 6.5 - 8.1 g/dL   Albumin 3.1 (L) 3.5 - 5.0 g/dL   AST 15 15 - 41 U/L   ALT 12 (L) 14 - 54 U/L   Alkaline Phosphatase 86 38 - 126 U/L   Total Bilirubin 0.7 0.3 - 1.2 mg/dL   GFR calc non Af Amer >60 >60 mL/min   GFR calc Af Amer >60 >60 mL/min   Anion gap 7 5 - 15  Amylase     Status: None   Collection Time: 06/01/15  5:02 AM  Result Value Ref Range   Amylase 42 28 - 100 U/L  Lipase, blood     Status: Abnormal   Collection Time: 06/01/15  5:02 AM  Result Value Ref Range   Lipase 11 (L) 22 - 51 U/L       IMAGING US Abdomen Limited  06/01/2015   CLINICAL DATA:  Severe upper abdominal pain  EXAM: US ABDOMEN LIMITED - RIGHT UPPER QUADRANT  COMPARISON:  None.  FINDINGS: Gallbladder:  Several calculi are present in the gallbladder neck. There is no gallbladder mural thickening or pericholecystic fluid. The patient was not tender over the gallbladder.  Common bile duct:  Diameter: 3.9 mm, normal.  Liver:  No focal lesion identified. Within normal limits in parenchymal echogenicity.  IMPRESSION: Cholelithiasis without sonographic evidence of cholecystitis.   Electronically Signed   By: Ellery Plunk M.D.   On: 06/01/2015 05:53   Limited OB U/S Preliminary report with normal FHR, amniotic fluid, cervical length, and placenta  Medical Management Labs, imaging ordered and results reviewed. Pt pain reduced from 9/10 to 6/10 over time while in MAU without medication.  GI Cocktail given and in 10-15 minutes pt reports pain 0/10, completely resolved.  Pt discharged in stable condition.  ASSESSMENT 1. Cholelithiasis affecting pregnancy in second trimester, antepartum   2. Abdominal pain in pregnancy   3. Gastroesophageal reflux disease without esophagitis     PLAN Consult Dr Arelia Sneddon, reviewed assessment, labs, imaging Discharge home Zantac 150 mg BID Low-fat diet F/U in office next week   Follow-up Information     Schedule an appointment as soon as possible for a visit with Juluis Mire, MD.   Specialty:  Obstetrics and Gynecology   Contact information:   [address] [place] [address] [phone]       Follow up with THE [facility]  MATERNITY ADMISSIONS.   Why:  As needed for emergencies   Contact information:   [address] [id number] mc [place] [address] [phone]      Sharen Counter Certified Nurse-Midwife 06/01/2015  7:02 AM

## 2015-06-01 NOTE — MAU Note (Signed)
Severe upper abd pain since MN. Started lower abd but moved up and is now upper abd. Nausea but no vomiting or diarrhea. No vag bleeding or d/c

## 2015-06-01 NOTE — Discharge Instructions (Signed)
Cholelithiasis Cholelithiasis (also called gallstones) is a form of gallbladder disease in which gallstones form in your gallbladder. The gallbladder is an organ that stores bile made in the liver, which helps digest fats. Gallstones begin as small crystals and slowly grow into stones. Gallstone pain occurs when the gallbladder spasms and a gallstone is blocking the duct. Pain can also occur when a stone passes out of the duct.  RISK FACTORS  Being female.   Having multiple pregnancies. Health care providers sometimes advise removing diseased gallbladders before future pregnancies.   Being obese.  Eating a diet heavy in fried foods and fat.   Being older than 60 years and increasing age.   Prolonged use of medicines containing female hormones.   Having diabetes mellitus.   Rapidly losing weight.   Having a family history of gallstones (heredity).  SYMPTOMS  Nausea.   Vomiting.  Abdominal pain.   Yellowing of the skin (jaundice).   Sudden pain. It may persist from several minutes to several hours.  Fever.   Tenderness to the touch. In some cases, when gallstones do not move into the bile duct, people have no pain or symptoms. These are called "silent" gallstones.  TREATMENT Silent gallstones do not need treatment. In severe cases, emergency surgery may be required. Options for treatment include:  Surgery to remove the gallbladder. This is the most common treatment.  Medicines. These do not always work and may take 6-12 months or more to work.  Shock wave treatment (extracorporeal biliary lithotripsy). In this treatment an ultrasound machine sends shock waves to the gallbladder to break gallstones into smaller pieces that can pass into the intestines or be dissolved by medicine. HOME CARE INSTRUCTIONS   Only take over-the-counter or prescription medicines for pain, discomfort, or fever as directed by your health care provider.   Follow a low-fat diet until  seen again by your health care provider. Fat causes the gallbladder to contract, which can result in pain.   Follow up with your health care provider as directed. Attacks are almost always recurrent and surgery is usually required for permanent treatment.  SEEK IMMEDIATE MEDICAL CARE IF:   Your pain increases and is not controlled by medicines.   You have a fever or persistent symptoms for more than 2-3 days.   You have a fever and your symptoms suddenly get worse.   You have persistent nausea and vomiting.  MAKE SURE YOU:   Understand these instructions.  Will watch your condition.  Will get help right away if you are not doing well or get worse. Document Released: 10/08/2005 Document Revised: 06/14/2013 Document Reviewed: 04/05/2013 [facility] Patient Information 2015 [place], [place]. This information is not intended to replace advice given to you by your health care provider. Make sure you discuss any questions you have with your health care provider.  Gastroesophageal Reflux, Adult Gastroesophageal reflux disease (GERD) happens when acid from your stomach flows up into the esophagus. When acid comes in contact with the esophagus, the acid causes soreness (inflammation) in the esophagus. Over time, GERD may create small holes (ulcers) in the lining of the esophagus. CAUSES   Increased body weight. This puts pressure on the stomach, making acid rise from the stomach into the esophagus.  Smoking. This increases acid production in the stomach.  Drinking alcohol. This causes decreased pressure in the lower esophageal sphincter (valve or ring of muscle between the esophagus and stomach), allowing acid from the stomach into the esophagus.  Late evening meals and  a full stomach. This increases pressure and acid production in the stomach.  A malformed lower esophageal sphincter.  Pregnancy Sometimes, no cause is found. SYMPTOMS   Burning pain in the lower part of the mid-chest  behind the breastbone and in the mid-stomach area. This may occur twice a week or more often.  Trouble swallowing.  Sore throat.  Dry cough.  Asthma-like symptoms including chest tightness, shortness of breath, or wheezing. DIAGNOSIS  Your caregiver may be able to diagnose GERD based on your symptoms. In some cases, X-rays and other tests may be done to check for complications or to check the condition of your stomach and esophagus. TREATMENT  Your caregiver may recommend over-the-counter or prescription medicines to help decrease acid production. Ask your caregiver before starting or adding any new medicines.  HOME CARE INSTRUCTIONS   Change the factors that you can control. Ask your caregiver for guidance concerning weight loss, quitting smoking, and alcohol consumption.  Avoid foods and drinks that make your symptoms worse, such as:  Caffeine or alcoholic drinks.  Chocolate.  Peppermint or mint flavorings.  Garlic and onions.  Spicy foods.  Citrus fruits, such as oranges, lemons, or limes.  Tomato-based foods such as sauce, chili, salsa, and pizza.  Fried and fatty foods.  Avoid lying down for the 3 hours prior to your bedtime or prior to taking a nap.  Eat small, frequent meals instead of large meals.  Wear loose-fitting clothing. Do not wear anything tight around your waist that causes pressure on your stomach.  Raise the head of your bed 6 to 8 inches with wood blocks to help you sleep. Extra pillows will not help.  Only take over-the-counter or prescription medicines for pain, discomfort, or fever as directed by your caregiver.  Do not take aspirin, ibuprofen, or other nonsteroidal anti-inflammatory drugs (NSAIDs). SEEK IMMEDIATE MEDICAL CARE IF:   You have pain in your arms, neck, jaw, teeth, or back.  Your pain increases or changes in intensity or duration.  You develop nausea, vomiting, or sweating (diaphoresis).  You develop shortness of breath, or you  faint.  Your vomit is green, yellow, black, or looks like coffee grounds or blood.  Your stool is red, bloody, or black. These symptoms could be signs of other problems, such as heart disease, gastric bleeding, or esophageal bleeding. MAKE SURE YOU:   Understand these instructions.  Will watch your condition.  Will get help right away if you are not doing well or get worse. Document Released: 07/22/2005 Document Revised: 01/04/2012 Document Reviewed: 05/01/2011 [facility] Patient Information 2015 [place], [place]. This information is not intended to replace advice given to you by your health care provider. Make sure you discuss any questions you have with your health care provider.  Low-Fat Diet for Pancreatitis or Gallbladder Conditions A low-fat diet can be helpful if you have pancreatitis or a gallbladder condition. With these conditions, your pancreas and gallbladder have trouble digesting fats. A healthy eating plan with less fat will help rest your pancreas and gallbladder and reduce your symptoms. WHAT DO I NEED TO KNOW ABOUT THIS DIET?  Eat a low-fat diet.  Reduce your fat intake to less than 20-30% of your total daily calories. This is less than 50-60 g of fat per day.  Remember that you need some fat in your diet. Ask your dietician what your daily goal should be.  Choose nonfat and low-fat healthy foods. Look for the words "nonfat," "low fat," or "fat free."  As  a guide, look on the label and choose foods with less than 3 g of fat per serving. Eat only one serving.  Avoid alcohol.  Do not smoke. If you need help quitting, talk with your health care provider.  Eat small frequent meals instead of three large heavy meals. WHAT FOODS CAN I EAT? Grains Include healthy grains and starches such as potatoes, wheat bread, fiber-rich cereal, and brown rice. Choose whole grain options whenever possible. In adults, whole grains should account for 45-65% of your daily calories.    Fruits and Vegetables Eat plenty of fruits and vegetables. Fresh fruits and vegetables add fiber to your diet. Meats and Other Protein Sources Eat lean meat such as chicken and pork. Trim any fat off of meat before cooking it. Eggs, fish, and beans are other sources of protein. In adults, these foods should account for 10-35% of your daily calories. Dairy Choose low-fat milk and dairy options. Dairy includes fat and protein, as well as calcium.  Fats and Oils Limit high-fat foods such as fried foods, sweets, baked goods, sugary drinks.  Other Creamy sauces and condiments, such as mayonnaise, can add extra fat. Think about whether or not you need to use them, or use smaller amounts or low fat options. WHAT FOODS ARE NOT RECOMMENDED?  High fat foods, such as:  [employer].  Ice cream.  [place] toast.  Sweet rolls.  Pizza.  Cheese bread.  Foods covered with batter, butter, creamy sauces, or cheese.  Fried foods.  Sugary drinks and desserts.  Foods that cause gas or bloating Document Released: 10/17/2013 Document Reviewed: 10/17/2013 [facility] Patient Information 2015 [place], [place]. This information is not intended to replace advice given to you by your health care provider. Make sure you discuss any questions you have with your health care provider.

## 2015-06-01 NOTE — Progress Notes (Signed)
[name] [name] CNM notified pt's labs have resulted and pt just returned from u/s. Will see pt

## 2015-06-01 NOTE — Progress Notes (Signed)
Lisa Leftwich-Kirby CNM in earlier to discuss test results and d/c plan. Written and verbal d/c instructions given and understanding voiced. 

## 2015-06-01 NOTE — MAU Note (Signed)
Pt has not voided since admission. States she voided just before arrival and since has not been able to drink has not needed to go to [facility].

## 2015-08-22 ENCOUNTER — Inpatient Hospital Stay (HOSPITAL_COMMUNITY)
Admission: AD | Admit: 2015-08-22 | Discharge: 2015-08-22 | Disposition: A | Discharge status: Home / Self Care | Payer: 59 | Source: Ambulatory Visit | Attending: Emergency Medicine | Admitting: Emergency Medicine

## 2015-08-22 ENCOUNTER — Encounter (HOSPITAL_COMMUNITY): Payer: Self-pay | Admitting: *Deleted

## 2015-08-22 DIAGNOSIS — O9A213 Injury, poisoning and certain other consequences of external causes complicating pregnancy, third trimester: Secondary | ICD-10-CM | POA: Diagnosis present

## 2015-08-22 DIAGNOSIS — Z3A36 36 weeks gestation of pregnancy: Secondary | ICD-10-CM

## 2015-08-22 DIAGNOSIS — Y92210 Daycare center as the place of occurrence of the external cause: Secondary | ICD-10-CM | POA: Diagnosis not present

## 2015-08-22 DIAGNOSIS — O26893 Other specified pregnancy related conditions, third trimester: Secondary | ICD-10-CM | POA: Diagnosis not present

## 2015-08-22 DIAGNOSIS — S0990XA Unspecified injury of head, initial encounter: Secondary | ICD-10-CM | POA: Diagnosis not present

## 2015-08-22 DIAGNOSIS — Y9389 Activity, other specified: Secondary | ICD-10-CM | POA: Diagnosis not present

## 2015-08-22 DIAGNOSIS — Y990 Civilian activity done for income or pay: Secondary | ICD-10-CM | POA: Diagnosis not present

## 2015-08-22 DIAGNOSIS — Z79899 Other long term (current) drug therapy: Secondary | ICD-10-CM | POA: Diagnosis not present

## 2015-08-22 DIAGNOSIS — W208XXA Other cause of strike by thrown, projected or falling object, initial encounter: Secondary | ICD-10-CM | POA: Insufficient documentation

## 2015-08-22 MED ORDER — ACETAMINOPHEN 500 MG PO TABS
1000.0000 mg | ORAL_TABLET | Freq: Once | ORAL | Status: AC
  Administered 2015-08-22: 1000 mg via ORAL
  Filled 2015-08-22: qty 2

## 2015-08-22 NOTE — MAU Note (Signed)
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   NOW  HEAD  ACHES .             LAST SEEN IN OFFICE   2 WEEKS  AGO-   SHE  HAS AN APPOINTMENT   1020AM  TOMORROW.

## 2015-08-22 NOTE — MAU Provider Note (Signed)
  History     CSN: [id number]  Arrival date and time: 08/22/15 [id number]   First Provider Initiated Contact with Patient 08/22/15 2021      No chief complaint on file.  HPI Comments: [name] [name] is a 19 y.o. G1P0 at 4573w5d who presents today after a blow to the head. 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 She states that she did not lose consciousness, but was lightheaded for about 10-15 mins afterward. She denies any abdominal pain, contractions, VB or LOF. She states that the fetus has been active.   Head Injury  The incident occurred 3 to 6 hours ago. The injury mechanism was a direct blow. There was no loss of consciousness (lighteded after it occured for about 10-15 mins ). There was no blood loss. The quality of the pain is described as aching. The pain is at a severity of 5/10. The pain has been constant since the injury. Associated symptoms include blurred vision and headaches. Pertinent negatives include no numbness, tinnitus or vomiting. She has tried nothing for the symptoms.    Past Medical History  Diagnosis Date  . Medical history non-contributory     Past Surgical History  Procedure Laterality Date  . No past surgeries      No family history on file.  Social History  Substance Use Topics  . Smoking status: Never Smoker   . Smokeless tobacco: Never Used  . Alcohol Use: No    Allergies:  Allergies  Allergen Reactions  . Shellfish Allergy Hives    Prescriptions prior to admission  Medication Sig Dispense Refill Last Dose  . Prenatal Vit-Fe Fumarate-FA (PRENATAL MULTIVITAMIN) TABS tablet Take 1 tablet by mouth daily at 12 noon.   08/21/2015 at Unknown time  . ranitidine (ZANTAC) 150 MG tablet Take 1 tablet (150 mg total) by mouth 2 (two) times daily. 60 tablet 0 08/21/2015 at Unknown time    Review of Systems  HENT: Negative for tinnitus.   Eyes: Positive for blurred vision.  Gastrointestinal: Negative for  nausea and vomiting.  Neurological: Positive for headaches. Negative for tingling, focal weakness and numbness.   Physical Exam   Blood pressure 120/77, pulse 114, temperature 98 F (36.7 C), temperature source Oral, resp. rate 20, height 5\' 2"  (1.575 m), weight 119.296 kg (263 lb), last menstrual period 04/04/2014.  Physical Exam  Nursing note and vitals reviewed. Constitutional: She is oriented to person, place, and time. She appears well-developed and well-nourished.  HENT:  Head: Normocephalic and atraumatic.  No visible injury  Eyes: Pupils are equal, round, and reactive to light.  Respiratory: Effort normal.  GI: Soft. There is no tenderness.  Musculoskeletal:  No point tenderness along spine.   Neurological: She is alert and oriented to person, place, and time.  Skin: Skin is warm.  Psychiatric: She has a normal mood and affect.   FHT: 145, moderate with 15x15 accels, no decels Toco: no UCs  MAU Course  Procedures  MDM 2032: D/W Dr. Marcelle OverlieHolland. [facility].  2038: [facility]. Transfer accepted.  2102: [facility] on the [facility].   Assessment and Plan   1. Head trauma, initial encounter    [facility] for further evaluation  Tawnya CrookHogan, Heather Donovan 08/22/2015, 8:22 PM

## 2015-08-22 NOTE — Discharge Instructions (Signed)

## 2015-08-22 NOTE — ED Notes (Signed)
Patient is alert and orientedx4.  Patient was explained discharge instructions and they understood them with no questions.   

## 2015-08-22 NOTE — ED Provider Notes (Signed)
CSN: [id number]     Arrival date & time 08/22/15  1938 History   First MD Initiated Contact with Patient 08/22/15 2138     Chief Complaint  Patient presents with  . Head Injury     (Consider location/radiation/quality/duration/timing/severity/associated sxs/prior Treatment) HPI Patient reports a wall fell on her at work and hit her on the head. She did not get knocked out. She reports some pain in the back of her head. She has not had any visual change. There's been no nausea or vomiting. No neck pain. She reports she did not feel a knot or lump on the back of her head. [facility] because she is [redacted] weeks pregnant. There is a cleared her from any pregnancy related complications and advised for evaluation of the emergency department for head injury. Past Medical History  Diagnosis Date  . Medical history non-contributory    Past Surgical History  Procedure Laterality Date  . No past surgeries     History reviewed. No pertinent family history. Social History  Substance Use Topics  . Smoking status: Never Smoker   . Smokeless tobacco: Never Used  . Alcohol Use: No   OB History    Gravida Para Term Preterm AB TAB SAB Ectopic Multiple Living   1              Review of Systems 10 Systems reviewed and are negative for acute change except as noted in the HPI.    Allergies  Shellfish allergy  Home Medications   Prior to Admission medications   Medication Sig Start Date End Date Taking? Authorizing Provider  Prenatal Vit-Fe Fumarate-FA (PRENATAL MULTIVITAMIN) TABS tablet Take 1 tablet by mouth daily at 12 noon.   Yes Historical Provider, MD  ranitidine (ZANTAC) 150 MG tablet Take 1 tablet (150 mg total) by mouth 2 (two) times daily. 06/01/15  Yes Lisa A Leftwich-Kirby, CNM   BP 119/70 mmHg  Pulse 102  Temp(Src) 98.5 F (36.9 C) (Oral)  Resp 18  Ht 5\' 2"  (1.575 m)  Wt 263 lb (119.296 kg)  BMI 48.09 kg/m2  SpO2 100%  LMP 04/04/2014 Physical  Exam  Constitutional: She is oriented to person, place, and time. She appears well-developed and well-nourished.  HENT:  Head: Normocephalic and atraumatic.  Right Ear: External ear normal.  Left Ear: External ear normal.  Nose: Nose normal.  Mouth/Throat: Oropharynx is clear and moist.  Vital TMs are normal. There is no palpable hematoma on the back of the head and no reproducible tenderness.  Eyes: EOM are normal. Pupils are equal, round, and reactive to light.  Neck: Neck supple.  No C-spine tenderness  Cardiovascular: Normal rate, regular rhythm, normal heart sounds and intact distal pulses.   Pulmonary/Chest: Effort normal and breath sounds normal.  Abdominal:  Gravid abdomen  Musculoskeletal: Normal range of motion. She exhibits no edema.  Neurological: She is alert and oriented to person, place, and time. She has normal strength. No cranial nerve deficit. She exhibits normal muscle tone. Coordination normal. GCS eye subscore is 4. GCS verbal subscore is 5. GCS motor subscore is 6.  Skin: Skin is warm, dry and intact.  Psychiatric: She has a normal mood and affect.    ED Course  Procedures (including critical care time) Labs Review Labs Reviewed - No data to display  Imaging Review No results found. I have personally reviewed and evaluated these images and lab results as part of my medical decision-making.  EKG Interpretation None      MDM   Final diagnoses:  Head trauma, initial encounter  Minor head injury, initial encounter   Patient will be given head injury instruction precautions. At this time there does not appear to be serious intracranial injury. The patient is alert and appropriate and had no loss of consciousness. She has no associated neurologic symptoms. The patient's is her trimester gestation and had been seen at [facility] with no labor and no pregnancy-related complications. The patient is advised she can take Tylenol as needed for  headache.    Arby Barrette, MD 08/22/15 2300

## 2015-09-06 LAB — OB RESULTS CONSOLE GBS: GBS: POSITIVE

## 2015-09-13 ENCOUNTER — Inpatient Hospital Stay (HOSPITAL_COMMUNITY)
Admission: AD | Admit: 2015-09-13 | Discharge: 2015-09-14 | Disposition: A | Discharge status: Home / Self Care | Payer: Medicaid Other | Source: Ambulatory Visit | Attending: Obstetrics & Gynecology | Admitting: Obstetrics & Gynecology

## 2015-09-13 DIAGNOSIS — O471 False labor at or after 37 completed weeks of gestation: Secondary | ICD-10-CM

## 2015-09-13 DIAGNOSIS — R000 Tachycardia, unspecified: Secondary | ICD-10-CM

## 2015-09-13 NOTE — MAU Note (Signed)
Contractions since 0430. Denies LOF. Some bloody mucous

## 2015-09-14 ENCOUNTER — Encounter (HOSPITAL_COMMUNITY): Payer: Self-pay | Admitting: *Deleted

## 2015-09-14 NOTE — MAU Provider Note (Signed)
Chief Complaint:  Contractions   First Provider Initiated Contact with Patient 09/14/15 0147      HPI: [name] [name] is a 19 y.o. G1P0 at 1048w0d who presents to maternity admissions reporting contractions. Not in active labor, but maternal tachycardia incidentally noted. No associated chest pain or shortness of breath.  Upon review of previous notes in Epic patient has had pulses 90s-120s  Denies fever, chills, leakage of fluid or vaginal bleeding. Good fetal movement.   Past Medical History: Past Medical History  Diagnosis Date  . Medical history non-contributory     Past obstetric history: OB History  Gravida Para Term Preterm AB SAB TAB Ectopic Multiple Living  1             # Outcome Date GA Lbr Len/2nd Weight Sex Delivery Anes PTL Lv  1 Current               Past Surgical History: Past Surgical History  Procedure Laterality Date  . No past surgeries       Family History: History reviewed. No pertinent family history.  Social History: Social History  Substance Use Topics  . Smoking status: Never Smoker   . Smokeless tobacco: Never Used  . Alcohol Use: No    Allergies:  Allergies  Allergen Reactions  . Shellfish Allergy Hives    Meds:  Prescriptions prior to admission  Medication Sig Dispense Refill Last Dose  . ranitidine (ZANTAC) 150 MG tablet Take 1 tablet (150 mg total) by mouth 2 (two) times daily. 60 tablet 0 09/13/2015 at Unknown time  . Prenatal Vit-Fe Fumarate-FA (PRENATAL MULTIVITAMIN) TABS tablet Take 1 tablet by mouth daily at 12 noon.   More than a month at Unknown time    I have reviewed patient's Past Medical Hx, Surgical Hx, Family Hx, Social Hx, medications and allergies.   ROS:  Review of Systems  Constitutional: Negative for fever and chills.  Respiratory: Negative for shortness of breath.   Cardiovascular: Negative for chest pain.  Gastrointestinal: Positive for abdominal pain.  Genitourinary:       Negative for leaking of  fluid or vaginal bleeding. Positive fetal movement.    Physical Exam   Patient Vitals for the past 24 hrs:  BP Temp Pulse Resp SpO2 Height Weight  09/14/15 0140 - - (!) 122 - 96 % - -  09/14/15 0130 - - 106 - 95 % - -  09/14/15 0120 - - 119 - 97 % - -  09/14/15 0115 - - 113 - 98 % - -  09/14/15 0110 - - 118 - 98 % - -  09/14/15 0100 - - 111 - 98 % - -  09/14/15 0050 - - 117 - 97 % - -  09/14/15 0045 - - 113 - 98 % - -  09/14/15 0040 - - (!) 125 - 98 % - -  09/14/15 0015 126/75 mmHg - 119 - - - -  09/14/15 0012 126/75 mmHg - 119 - - - -  09/13/15 2344 119/77 mmHg - - - - - -  09/13/15 2341 135/88 mmHg 98.4 F (36.9 C) (!) 123 20 100 % 5' 0.05" (1.525 m) 263 lb 3.2 oz (119.387 kg)   Constitutional: Well-developed, well-nourished female in no acute distress.  Cardiovascular: Tachycardia. Regular rhythm. Respiratory: normal effort Neurologic: Alert and oriented x 4.  GU: Cervical exam: Dilation: Fingertip Effacement (%): 30 Cervical Position: Posterior Station: -3 Exam by:: Cala BradfordKimberly Whitehurst-Boyd RN  FHT:  Baseline 155 ,  moderate variability, accelerations present, no decelerations Contractions: Irregular   Labs: EKG: Sinus tachycardia with nonspecific T-wave changes  Imaging:  No results found.  MAU Course: EKG ordered by Dr. Langston Masker.  MDM: Asymptomatic sinus tachycardia. Discussed with Dr. Langston Masker. Stable for discharge.  Assessment: 1. Sinus tachycardia (HCC)   2. False labor at or after 37 completed weeks of gestation, antepartum     Plan: Discharge home in stable condition per consult with Dr. Langston Masker.  Labor precautions and fetal kick counts Go to ED if you experience chest pain, shortness of breath, dizziness.  Discuss possible outpatient cardiology referral at next prenatal appointment Follow-up as Scheduled for prenatal care.    Medication List    ASK your doctor about these medications        prenatal multivitamin Tabs tablet  Take 1 tablet by  mouth daily at 12 noon.     ranitidine 150 MG tablet  Commonly known as:  ZANTAC  Take 1 tablet (150 mg total) by mouth 2 (two) times daily.        [place], CNM 09/14/2015 1:42 AM

## 2015-09-14 NOTE — Discharge Instructions (Signed)
Fetal Movement Counts Patient Name: __________________________________________________ Patient Due Date: ____________________ Performing a fetal movement count is highly recommended in high-risk pregnancies, but it is good for every pregnant woman to do. Your health care provider may ask you to start counting fetal movements at 28 weeks of the pregnancy. Fetal movements often increase:  After eating a full meal.  After physical activity.  After eating or drinking something sweet or cold.  At rest. Pay attention to when you feel the baby is most active. This will help you notice a pattern of your baby's sleep and wake cycles and what factors contribute to an increase in fetal movement. It is important to perform a fetal movement count at the same time each day when your baby is normally most active.  HOW TO COUNT FETAL MOVEMENTS 1. Find a quiet and comfortable area to sit or lie down on your left side. Lying on your left side provides the best blood and oxygen circulation to your baby. 2. Write down the day and time on a sheet of paper or in a journal. 3. Start counting kicks, flutters, swishes, rolls, or jabs in a 2-hour period. You should feel at least 10 movements within 2 hours. 4. If you do not feel 10 movements in 2 hours, wait 2-3 hours and count again. Look for a change in the pattern or not enough counts in 2 hours. SEEK MEDICAL CARE IF:  You feel less than 10 counts in 2 hours, tried twice.  There is no movement in over an hour.  The pattern is changing or taking longer each day to reach 10 counts in 2 hours.  You feel the baby is not moving as he or she usually does. Date: ____________ Movements: ____________ Start time: ____________ Finish time: ____________  Date: ____________ Movements: ____________ Start time: ____________ Finish time: ____________ Date: ____________ Movements: ____________ Start time: ____________ Finish time: ____________ Date: ____________ Movements:  ____________ Start time: ____________ Finish time: ____________ Date: ____________ Movements: ____________ Start time: ____________ Finish time: ____________ Date: ____________ Movements: ____________ Start time: ____________ Finish time: ____________ Date: ____________ Movements: ____________ Start time: ____________ Finish time: ____________ Date: ____________ Movements: ____________ Start time: ____________ Finish time: ____________  Date: ____________ Movements: ____________ Start time: ____________ Finish time: ____________ Date: ____________ Movements: ____________ Start time: ____________ Finish time: ____________ Date: ____________ Movements: ____________ Start time: ____________ Finish time: ____________ Date: ____________ Movements: ____________ Start time: ____________ Finish time: ____________ Date: ____________ Movements: ____________ Start time: ____________ Finish time: ____________ Date: ____________ Movements: ____________ Start time: ____________ Finish time: ____________ Date: ____________ Movements: ____________ Start time: ____________ Finish time: ____________  Date: ____________ Movements: ____________ Start time: ____________ Finish time: ____________ Date: ____________ Movements: ____________ Start time: ____________ Finish time: ____________ Date: ____________ Movements: ____________ Start time: ____________ Finish time: ____________ Date: ____________ Movements: ____________ Start time: ____________ Finish time: ____________ Date: ____________ Movements: ____________ Start time: ____________ Finish time: ____________ Date: ____________ Movements: ____________ Start time: ____________ Finish time: ____________ Date: ____________ Movements: ____________ Start time: ____________ Finish time: ____________  Date: ____________ Movements: ____________ Start time: ____________ Finish time: ____________ Date: ____________ Movements: ____________ Start time: ____________ Finish  time: ____________ Date: ____________ Movements: ____________ Start time: ____________ Finish time: ____________ Date: ____________ Movements: ____________ Start time: ____________ Finish time: ____________ Date: ____________ Movements: ____________ Start time: ____________ Finish time: ____________ Date: ____________ Movements: ____________ Start time: ____________ Finish time: ____________ Date: ____________ Movements: ____________ Start time: ____________ Finish time: ____________  Date: ____________ Movements: ____________ Start time: ____________ Finish   time: ____________ Date: ____________ Movements: ____________ Start time: ____________ Finish time: ____________ Date: ____________ Movements: ____________ Start time: ____________ Finish time: ____________ Date: ____________ Movements: ____________ Start time: ____________ Finish time: ____________ Date: ____________ Movements: ____________ Start time: ____________ Finish time: ____________ Date: ____________ Movements: ____________ Start time: ____________ Finish time: ____________ Date: ____________ Movements: ____________ Start time: ____________ Finish time: ____________  Date: ____________ Movements: ____________ Start time: ____________ Finish time: ____________ Date: ____________ Movements: ____________ Start time: ____________ Finish time: ____________ Date: ____________ Movements: ____________ Start time: ____________ Finish time: ____________ Date: ____________ Movements: ____________ Start time: ____________ Finish time: ____________ Date: ____________ Movements: ____________ Start time: ____________ Finish time: ____________ Date: ____________ Movements: ____________ Start time: ____________ Finish time: ____________ Date: ____________ Movements: ____________ Start time: ____________ Finish time: ____________  Date: ____________ Movements: ____________ Start time: ____________ Finish time: ____________ Date: ____________  Movements: ____________ Start time: ____________ Finish time: ____________ Date: ____________ Movements: ____________ Start time: ____________ Finish time: ____________ Date: ____________ Movements: ____________ Start time: ____________ Finish time: ____________ Date: ____________ Movements: ____________ Start time: ____________ Finish time: ____________ Date: ____________ Movements: ____________ Start time: ____________ Finish time: ____________ Date: ____________ Movements: ____________ Start time: ____________ Finish time: ____________  Date: ____________ Movements: ____________ Start time: ____________ Finish time: ____________ Date: ____________ Movements: ____________ Start time: ____________ Finish time: ____________ Date: ____________ Movements: ____________ Start time: ____________ Finish time: ____________ Date: ____________ Movements: ____________ Start time: ____________ Finish time: ____________ Date: ____________ Movements: ____________ Start time: ____________ Finish time: ____________ Date: ____________ Movements: ____________ Start time: ____________ Finish time: ____________   This information is not intended to replace advice given to you by your health care provider. Make sure you discuss any questions you have with your health care provider.   Document Released: 11/11/2006 Document Revised: 11/02/2014 Document Reviewed: 08/08/2012 Elsevier Interactive Patient Education 2016 [employer] Braxton Hicks Contractions Contractions of the uterus can occur throughout pregnancy. Contractions are not always a sign that you are in labor.  WHAT ARE BRAXTON HICKS CONTRACTIONS?  Contractions that occur before labor are called Braxton Hicks contractions, or false labor. Toward the end of pregnancy (32-34 weeks), these contractions can develop more often and may become more forceful. This is not true labor because these contractions do not result in opening (dilatation) and thinning of  the cervix. They are sometimes difficult to tell apart from true labor because these contractions can be forceful and people have different pain tolerances. You should not feel embarrassed if you go to the hospital with false labor. Sometimes, the only way to tell if you are in true labor is for your health care provider to look for changes in the cervix. If there are no prenatal problems or other health problems associated with the pregnancy, it is completely safe to be sent home with false labor and await the onset of true labor. HOW CAN YOU TELL THE DIFFERENCE BETWEEN TRUE AND FALSE LABOR? False Labor  The contractions of false labor are usually shorter and not as hard as those of true labor.   The contractions are usually irregular.   The contractions are often felt in the front of the lower abdomen and in the groin.   The contractions may go away when you walk around or change positions while lying down.   The contractions get weaker and are shorter lasting as time goes on.   The contractions do not usually become progressively stronger, regular, and closer together as with true labor.  True Labor 5. Contractions in true   labor last 30-70 seconds, become very regular, usually become more intense, and increase in frequency.  6. The contractions do not go away with walking.  7. The discomfort is usually felt in the top of the uterus and spreads to the lower abdomen and low back.  8. True labor can be determined by your health care provider with an exam. This will show that the cervix is dilating and getting thinner.  WHAT TO REMEMBER  Keep up with your usual exercises and follow other instructions given by your health care provider.   Take medicines as directed by your health care provider.   Keep your regular prenatal appointments.   Eat and drink lightly if you think you are going into labor.   If Braxton Hicks contractions are making you uncomfortable:   Change  your position from lying down or resting to walking, or from walking to resting.   Sit and rest in a tub of warm water.   Drink 2-3 glasses of water. Dehydration may cause these contractions.   Do slow and deep breathing several times an hour.  WHEN SHOULD I SEEK IMMEDIATE MEDICAL CARE? Seek immediate medical care if:  Your contractions become stronger, more regular, and closer together.   You have fluid leaking or gushing from your vagina.   You have a fever.   You pass blood-tinged mucus.   You have vaginal bleeding.   You have continuous abdominal pain.   You have low back pain that you never had before.   You feel your baby's head pushing down and causing pelvic pressure.   Your baby is not moving as much as it used to.    This information is not intended to replace advice given to you by your health care provider. Make sure you discuss any questions you have with your health care provider.   Document Released: 10/12/2005 Document Revised: 10/17/2013 Document Reviewed: 07/24/2013 Elsevier Interactive Patient Education 2016 [employer]  

## 2015-09-16 ENCOUNTER — Encounter (HOSPITAL_COMMUNITY): Payer: Self-pay | Admitting: *Deleted

## 2015-09-16 ENCOUNTER — Telehealth (HOSPITAL_COMMUNITY): Payer: Self-pay | Admitting: *Deleted

## 2015-09-16 ENCOUNTER — Inpatient Hospital Stay (HOSPITAL_COMMUNITY)
Admission: AD | Admit: 2015-09-16 | Discharge: 2015-09-16 | Disposition: A | Discharge status: Home / Self Care | Payer: Medicaid Other | Source: Ambulatory Visit | Attending: Obstetrics & Gynecology | Admitting: Obstetrics & Gynecology

## 2015-09-16 HISTORY — DX: Gastro-esophageal reflux disease without esophagitis: K21.9

## 2015-09-16 MED ORDER — ZOLPIDEM TARTRATE 5 MG PO TABS
5.0000 mg | ORAL_TABLET | Freq: Once | ORAL | Status: AC
  Administered 2015-09-16: 5 mg via ORAL
  Filled 2015-09-16: qty 1

## 2015-09-16 NOTE — Telephone Encounter (Signed)
Preadmission screen  

## 2015-09-16 NOTE — Discharge Instructions (Signed)
Braxton Hicks Contractions °Contractions of the uterus can occur throughout pregnancy. Contractions are not always a sign that you are in labor.  °WHAT ARE BRAXTON HICKS CONTRACTIONS?  °Contractions that occur before labor are called Braxton Hicks contractions, or false labor. Toward the end of pregnancy (32-34 weeks), these contractions can develop more often and may become more forceful. This is not true labor because these contractions do not result in opening (dilatation) and thinning of the cervix. They are sometimes difficult to tell apart from true labor because these contractions can be forceful and people have different pain tolerances. You should not feel embarrassed if you go to the hospital with false labor. Sometimes, the only way to tell if you are in true labor is for your health care provider to look for changes in the cervix. °If there are no prenatal problems or other health problems associated with the pregnancy, it is completely safe to be sent home with false labor and await the onset of true labor. °HOW CAN YOU TELL THE DIFFERENCE BETWEEN TRUE AND FALSE LABOR? °False Labor °· The contractions of false labor are usually shorter and not as hard as those of true labor.   °· The contractions are usually irregular.   °· The contractions are often felt in the front of the lower abdomen and in the groin.   °· The contractions may go away when you walk around or change positions while lying down.   °· The contractions get weaker and are shorter lasting as time goes on.   °· The contractions do not usually become progressively stronger, regular, and closer together as with true labor.   °True Labor °· Contractions in true labor last 30-70 seconds, become very regular, usually become more intense, and increase in frequency.   °· The contractions do not go away with walking.   °· The discomfort is usually felt in the top of the uterus and spreads to the lower abdomen and low back.   °· True labor can be  determined by your health care provider with an exam. This will show that the cervix is dilating and getting thinner.   °WHAT TO REMEMBER °· Keep up with your usual exercises and follow other instructions given by your health care provider.   °· Take medicines as directed by your health care provider.   °· Keep your regular prenatal appointments.   °· Eat and drink lightly if you think you are going into labor.   °· If Braxton Hicks contractions are making you uncomfortable:   °¨ Change your position from lying down or resting to walking, or from walking to resting.   °¨ Sit and rest in a tub of warm water.   °¨ Drink 2-3 glasses of water. Dehydration may cause these contractions.   °¨ Do slow and deep breathing several times an hour.   °WHEN SHOULD I SEEK IMMEDIATE MEDICAL CARE? °Seek immediate medical care if: °· Your contractions become stronger, more regular, and closer together.   °· You have fluid leaking or gushing from your vagina.   °· You have a fever.   °· You pass blood-tinged mucus.   °· You have vaginal bleeding.   °· You have continuous abdominal pain.   °· You have low back pain that you never had before.   °· You feel your baby's head pushing down and causing pelvic pressure.   °· Your baby is not moving as much as it used to.   °  °This information is not intended to replace advice given to you by your health care provider. Make sure you discuss any questions you have with your health care   provider. °  °Document Released: 10/12/2005 Document Revised: 10/17/2013 Document Reviewed: 07/24/2013 °Elsevier Interactive Patient Education ©2016 [employer] ° °

## 2015-09-16 NOTE — MAU Note (Signed)
Pt reports worsening contractions. Denies bleeding, some mucous.

## 2015-09-17 ENCOUNTER — Inpatient Hospital Stay (HOSPITAL_COMMUNITY)
Admission: AD | Admit: 2015-09-17 | Discharge: 2015-09-17 | Disposition: A | Discharge status: Home / Self Care | Payer: Medicaid Other | Source: Ambulatory Visit | Attending: Obstetrics and Gynecology | Admitting: Obstetrics and Gynecology

## 2015-09-17 ENCOUNTER — Encounter (HOSPITAL_COMMUNITY): Payer: Self-pay | Admitting: *Deleted

## 2015-09-17 NOTE — Discharge Instructions (Signed)
Braxton Hicks Contractions °Contractions of the uterus can occur throughout pregnancy. Contractions are not always a sign that you are in labor.  °WHAT ARE BRAXTON HICKS CONTRACTIONS?  °Contractions that occur before labor are called Braxton Hicks contractions, or false labor. Toward the end of pregnancy (32-34 weeks), these contractions can develop more often and may become more forceful. This is not true labor because these contractions do not result in opening (dilatation) and thinning of the cervix. They are sometimes difficult to tell apart from true labor because these contractions can be forceful and people have different pain tolerances. You should not feel embarrassed if you go to the hospital with false labor. Sometimes, the only way to tell if you are in true labor is for your health care provider to look for changes in the cervix. °If there are no prenatal problems or other health problems associated with the pregnancy, it is completely safe to be sent home with false labor and await the onset of true labor. °HOW CAN YOU TELL THE DIFFERENCE BETWEEN TRUE AND FALSE LABOR? °False Labor °· The contractions of false labor are usually shorter and not as hard as those of true labor.   °· The contractions are usually irregular.   °· The contractions are often felt in the front of the lower abdomen and in the groin.   °· The contractions may go away when you walk around or change positions while lying down.   °· The contractions get weaker and are shorter lasting as time goes on.   °· The contractions do not usually become progressively stronger, regular, and closer together as with true labor.   °True Labor °· Contractions in true labor last 30-70 seconds, become very regular, usually become more intense, and increase in frequency.   °· The contractions do not go away with walking.   °· The discomfort is usually felt in the top of the uterus and spreads to the lower abdomen and low back.   °· True labor can be  determined by your health care provider with an exam. This will show that the cervix is dilating and getting thinner.   °WHAT TO REMEMBER °· Keep up with your usual exercises and follow other instructions given by your health care provider.   °· Take medicines as directed by your health care provider.   °· Keep your regular prenatal appointments.   °· Eat and drink lightly if you think you are going into labor.   °· If Braxton Hicks contractions are making you uncomfortable:   °¨ Change your position from lying down or resting to walking, or from walking to resting.   °¨ Sit and rest in a tub of warm water.   °¨ Drink 2-3 glasses of water. Dehydration may cause these contractions.   °¨ Do slow and deep breathing several times an hour.   °WHEN SHOULD I SEEK IMMEDIATE MEDICAL CARE? °Seek immediate medical care if: °· Your contractions become stronger, more regular, and closer together.   °· You have fluid leaking or gushing from your vagina.   °· You have a fever.   °· You pass blood-tinged mucus.   °· You have vaginal bleeding.   °· You have continuous abdominal pain.   °· You have low back pain that you never had before.   °· You feel your baby's head pushing down and causing pelvic pressure.   °· Your baby is not moving as much as it used to.   °  °This information is not intended to replace advice given to you by your health care provider. Make sure you discuss any questions you have with your health care   provider. °  °Document Released: 10/12/2005 Document Revised: 10/17/2013 Document Reviewed: 07/24/2013 °Elsevier Interactive Patient Education ©2016 [employer] ° °

## 2015-09-17 NOTE — MAU Note (Signed)
Contractions since Friday night. Have gotten stronger. Denies LOF or bleeding

## 2015-09-17 NOTE — Progress Notes (Signed)
Written and verbal d/c instructions given and understanding voiced. Encouraged to drink plenty fld this afternoon. May eat

## 2015-09-17 NOTE — H&P (Signed)
[name] IsaacsMaleka L [name]  DICTATION # [id number] CSN# [id number]   Meriel PicaHOLLAND,[name], MD 09/17/2015 12:42 PM

## 2015-09-17 NOTE — Progress Notes (Signed)
Dr Arelia SneddonMcComb notified of pt's admission and status. Aware of ctx pattern, sve, reactive fhr with occ variables-baby active. Stable for d/c home and [facility]

## 2015-09-18 ENCOUNTER — Encounter (HOSPITAL_COMMUNITY)
Admission: RE | Disposition: A | Discharge status: Home / Self Care | Payer: Self-pay | Source: Ambulatory Visit | Attending: Obstetrics and Gynecology

## 2015-09-18 ENCOUNTER — Encounter (HOSPITAL_COMMUNITY): Payer: Self-pay

## 2015-09-18 ENCOUNTER — Inpatient Hospital Stay (HOSPITAL_COMMUNITY)
Admission: RE | Admit: 2015-09-18 | Discharge: 2015-09-20 | DRG: 765 | Disposition: A | Discharge status: Home / Self Care | Payer: Medicaid Other | Source: Ambulatory Visit | Attending: Obstetrics and Gynecology | Admitting: Obstetrics and Gynecology

## 2015-09-18 ENCOUNTER — Inpatient Hospital Stay (HOSPITAL_COMMUNITY): Payer: Medicaid Other | Admitting: Anesthesiology

## 2015-09-18 DIAGNOSIS — K8020 Calculus of gallbladder without cholecystitis without obstruction: Secondary | ICD-10-CM | POA: Diagnosis present

## 2015-09-18 DIAGNOSIS — O99214 Obesity complicating childbirth: Secondary | ICD-10-CM | POA: Diagnosis present

## 2015-09-18 DIAGNOSIS — Z6841 Body Mass Index (BMI) 40.0 and over, adult: Secondary | ICD-10-CM | POA: Diagnosis not present

## 2015-09-18 DIAGNOSIS — K219 Gastro-esophageal reflux disease without esophagitis: Secondary | ICD-10-CM | POA: Diagnosis present

## 2015-09-18 DIAGNOSIS — O9962 Diseases of the digestive system complicating childbirth: Secondary | ICD-10-CM | POA: Diagnosis present

## 2015-09-18 DIAGNOSIS — Z87891 Personal history of nicotine dependence: Secondary | ICD-10-CM

## 2015-09-18 DIAGNOSIS — O480 Post-term pregnancy: Secondary | ICD-10-CM | POA: Diagnosis present

## 2015-09-18 DIAGNOSIS — O339 Maternal care for disproportion, unspecified: Secondary | ICD-10-CM | POA: Diagnosis present

## 2015-09-18 DIAGNOSIS — O99824 Streptococcus B carrier state complicating childbirth: Secondary | ICD-10-CM | POA: Diagnosis present

## 2015-09-18 DIAGNOSIS — Z3A40 40 weeks gestation of pregnancy: Secondary | ICD-10-CM

## 2015-09-18 DIAGNOSIS — Z3490 Encounter for supervision of normal pregnancy, unspecified, unspecified trimester: Secondary | ICD-10-CM

## 2015-09-18 LAB — CBC
HCT: 28.6 % — ABNORMAL LOW (ref 36.0–46.0)
HEMATOCRIT: 35.8 % — AB (ref 36.0–46.0)
Hemoglobin: 11.7 g/dL — ABNORMAL LOW (ref 12.0–15.0)
Hemoglobin: 9.2 g/dL — ABNORMAL LOW (ref 12.0–15.0)
MCH: 27 pg (ref 26.0–34.0)
MCH: 26.6 pg (ref 26.0–34.0)
MCHC: 32.7 g/dL (ref 30.0–36.0)
MCHC: 32.2 g/dL (ref 30.0–36.0)
MCV: 82.7 fL (ref 78.0–100.0)
MCV: 82.7 fL (ref 78.0–100.0)
PLATELETS: 299 10*3/uL (ref 150–400)
PLATELETS: 236 10*3/uL (ref 150–400)
RBC: 4.33 MIL/uL (ref 3.87–5.11)
RBC: 3.46 MIL/uL — ABNORMAL LOW (ref 3.87–5.11)
RDW: 14.4 % (ref 11.5–15.5)
RDW: 14.5 % (ref 11.5–15.5)
WBC: 12.6 10*3/uL — AB (ref 4.0–10.5)
WBC: 17.3 10*3/uL — ABNORMAL HIGH (ref 4.0–10.5)

## 2015-09-18 LAB — RPR: RPR: NONREACTIVE

## 2015-09-18 LAB — TYPE AND SCREEN
ABO/RH(D): O POS
ANTIBODY SCREEN: NEGATIVE

## 2015-09-18 LAB — ABO/RH: ABO/RH(D): O POS

## 2015-09-18 SURGERY — Surgical Case
Anesthesia: Regional

## 2015-09-18 MED ORDER — DIPHENHYDRAMINE HCL 25 MG PO CAPS: 25.0000 mg | ORAL_CAPSULE | ORAL | Status: DC | PRN

## 2015-09-18 MED ORDER — PRENATAL MULTIVITAMIN CH
1.0000 | ORAL_TABLET | Freq: Every day | ORAL | Status: DC
  Administered 2015-09-19: 1 via ORAL
  Filled 2015-09-18: qty 1

## 2015-09-18 MED ORDER — DIPHENHYDRAMINE HCL 50 MG/ML IJ SOLN: 12.5000 mg | INTRAMUSCULAR | Status: DC | PRN

## 2015-09-18 MED ORDER — ONDANSETRON HCL 4 MG/2ML IJ SOLN: 4.0000 mg | Freq: Three times a day (TID) | INTRAMUSCULAR | Status: DC | PRN

## 2015-09-18 MED ORDER — PHENYLEPHRINE HCL 10 MG/ML IJ SOLN
INTRAMUSCULAR | Status: DC | PRN
  Administered 2015-09-18: 80 ug via INTRAVENOUS
  Administered 2015-09-18 (×2): 40 ug via INTRAVENOUS

## 2015-09-18 MED ORDER — NALOXONE HCL 0.4 MG/ML IJ SOLN: 0.4000 mg | INTRAMUSCULAR | Status: DC | PRN

## 2015-09-18 MED ORDER — OXYTOCIN 40 UNITS IN LACTATED RINGERS INFUSION - SIMPLE MED
62.5000 mL/h | INTRAVENOUS | Status: DC
  Filled 2015-09-18: qty 1000

## 2015-09-18 MED ORDER — ACETAMINOPHEN 500 MG PO TABS: 1000.0000 mg | ORAL_TABLET | Freq: Four times a day (QID) | ORAL | Status: DC

## 2015-09-18 MED ORDER — SIMETHICONE 80 MG PO CHEW
80.0000 mg | CHEWABLE_TABLET | ORAL | Status: DC
  Administered 2015-09-19 – 2015-09-20 (×2): 80 mg via ORAL
  Filled 2015-09-18 (×2): qty 1

## 2015-09-18 MED ORDER — TETANUS-DIPHTH-ACELL PERTUSSIS 5-2.5-18.5 LF-MCG/0.5 IM SUSP: 0.5000 mL | Freq: Once | INTRAMUSCULAR | Status: DC

## 2015-09-18 MED ORDER — OXYCODONE-ACETAMINOPHEN 5-325 MG PO TABS
2.0000 | ORAL_TABLET | ORAL | Status: DC | PRN
  Administered 2015-09-20 (×2): 2 via ORAL
  Filled 2015-09-18: qty 2

## 2015-09-18 MED ORDER — LANOLIN HYDROUS EX OINT: 1.0000 "application " | TOPICAL_OINTMENT | CUTANEOUS | Status: DC | PRN

## 2015-09-18 MED ORDER — OXYTOCIN 40 UNITS IN LACTATED RINGERS INFUSION - SIMPLE MED: 62.5000 mL/h | INTRAVENOUS | Status: AC

## 2015-09-18 MED ORDER — SIMETHICONE 80 MG PO CHEW
80.0000 mg | CHEWABLE_TABLET | Freq: Three times a day (TID) | ORAL | Status: DC
  Administered 2015-09-19 – 2015-09-20 (×4): 80 mg via ORAL
  Filled 2015-09-18 (×4): qty 1

## 2015-09-18 MED ORDER — DIPHENHYDRAMINE HCL 25 MG PO CAPS: 25.0000 mg | ORAL_CAPSULE | Freq: Four times a day (QID) | ORAL | Status: DC | PRN

## 2015-09-18 MED ORDER — OXYCODONE-ACETAMINOPHEN 5-325 MG PO TABS: 1.0000 | ORAL_TABLET | ORAL | Status: DC | PRN

## 2015-09-18 MED ORDER — TERBUTALINE SULFATE 1 MG/ML IJ SOLN: 0.2500 mg | Freq: Once | INTRAMUSCULAR | Status: DC | PRN

## 2015-09-18 MED ORDER — MEPERIDINE HCL 25 MG/ML IJ SOLN: 6.2500 mg | INTRAMUSCULAR | Status: DC | PRN

## 2015-09-18 MED ORDER — OXYTOCIN 10 UNIT/ML IJ SOLN
40.0000 [IU] | INTRAMUSCULAR | Status: DC | PRN
  Administered 2015-09-18: 40 [IU] via INTRAVENOUS

## 2015-09-18 MED ORDER — LACTATED RINGERS IV SOLN
500.0000 mL | INTRAVENOUS | Status: DC | PRN
  Administered 2015-09-18: 500 mL via INTRAVENOUS

## 2015-09-18 MED ORDER — PENICILLIN G POTASSIUM 5000000 UNITS IJ SOLR
2.5000 10*6.[IU] | INTRAVENOUS | Status: DC
  Administered 2015-09-18 (×2): 2.5 10*6.[IU] via INTRAVENOUS
  Filled 2015-09-18 (×6): qty 2.5

## 2015-09-18 MED ORDER — FENTANYL 2.5 MCG/ML BUPIVACAINE 1/10 % EPIDURAL INFUSION (WH - ANES)
INTRAMUSCULAR | Status: DC | PRN
  Administered 2015-09-18: 14 mL/h via EPIDURAL

## 2015-09-18 MED ORDER — PROMETHAZINE HCL 25 MG/ML IJ SOLN: 6.2500 mg | INTRAMUSCULAR | Status: DC | PRN

## 2015-09-18 MED ORDER — SIMETHICONE 80 MG PO CHEW: 80.0000 mg | CHEWABLE_TABLET | ORAL | Status: DC | PRN

## 2015-09-18 MED ORDER — PHENYLEPHRINE 40 MCG/ML (10ML) SYRINGE FOR IV PUSH (FOR BLOOD PRESSURE SUPPORT)
80.0000 ug | PREFILLED_SYRINGE | INTRAVENOUS | Status: DC | PRN
  Filled 2015-09-18: qty 20

## 2015-09-18 MED ORDER — NALBUPHINE HCL 10 MG/ML IJ SOLN: 5.0000 mg | INTRAMUSCULAR | Status: DC | PRN

## 2015-09-18 MED ORDER — ONDANSETRON HCL 4 MG/2ML IJ SOLN: 4.0000 mg | Freq: Four times a day (QID) | INTRAMUSCULAR | Status: DC | PRN

## 2015-09-18 MED ORDER — LACTATED RINGERS IV SOLN
INTRAVENOUS | Status: DC
  Administered 2015-09-18 (×3): via INTRAVENOUS

## 2015-09-18 MED ORDER — DEXTROSE 5 % IV SOLN
2.0000 g | Freq: Once | INTRAVENOUS | Status: AC
  Administered 2015-09-18: 2 g via INTRAVENOUS
  Filled 2015-09-18: qty 2

## 2015-09-18 MED ORDER — ONDANSETRON HCL 4 MG/2ML IJ SOLN
INTRAMUSCULAR | Status: DC | PRN
  Administered 2015-09-18: 4 mg via INTRAVENOUS

## 2015-09-18 MED ORDER — NALBUPHINE HCL 10 MG/ML IJ SOLN: 5.0000 mg | Freq: Once | INTRAMUSCULAR | Status: DC | PRN

## 2015-09-18 MED ORDER — OXYTOCIN BOLUS FROM INFUSION: 500.0000 mL | INTRAVENOUS | Status: DC

## 2015-09-18 MED ORDER — FENTANYL 2.5 MCG/ML BUPIVACAINE 1/10 % EPIDURAL INFUSION (WH - ANES)
14.0000 mL/h | INTRAMUSCULAR | Status: DC | PRN
Start: 2015-09-18 — End: 2015-09-18

## 2015-09-18 MED ORDER — MISOPROSTOL 25 MCG QUARTER TABLET: 25.0000 ug | ORAL_TABLET | ORAL | Status: DC | PRN

## 2015-09-18 MED ORDER — SCOPOLAMINE 1 MG/3DAYS TD PT72: 1.0000 | MEDICATED_PATCH | Freq: Once | TRANSDERMAL | Status: DC

## 2015-09-18 MED ORDER — SCOPOLAMINE 1 MG/3DAYS TD PT72
MEDICATED_PATCH | TRANSDERMAL | Status: DC | PRN
  Administered 2015-09-18: 1 via TRANSDERMAL

## 2015-09-18 MED ORDER — KETOROLAC TROMETHAMINE 30 MG/ML IJ SOLN
INTRAMUSCULAR | Status: AC
  Filled 2015-09-18: qty 1

## 2015-09-18 MED ORDER — FENTANYL CITRATE (PF) 100 MCG/2ML IJ SOLN: 25.0000 ug | INTRAMUSCULAR | Status: DC | PRN

## 2015-09-18 MED ORDER — DEXTROSE 5 % IV SOLN
1.0000 ug/kg/h | INTRAVENOUS | Status: DC | PRN
  Filled 2015-09-18: qty 2

## 2015-09-18 MED ORDER — LIDOCAINE HCL (PF) 1 % IJ SOLN: 30.0000 mL | INTRAMUSCULAR | Status: DC | PRN

## 2015-09-18 MED ORDER — SODIUM BICARBONATE 8.4 % IV SOLN
INTRAVENOUS | Status: DC | PRN
  Administered 2015-09-18 (×5): 5 mL via EPIDURAL

## 2015-09-18 MED ORDER — MORPHINE SULFATE (PF) 0.5 MG/ML IJ SOLN
INTRAMUSCULAR | Status: DC | PRN
  Administered 2015-09-18: 4 mg via EPIDURAL

## 2015-09-18 MED ORDER — NALOXONE HCL 2 MG/2ML IJ SOSY
1.0000 ug/kg/h | PREFILLED_SYRINGE | INTRAVENOUS | Status: DC | PRN
  Filled 2015-09-18: qty 2

## 2015-09-18 MED ORDER — OXYTOCIN 40 UNITS IN LACTATED RINGERS INFUSION - SIMPLE MED
1.0000 m[IU]/min | INTRAVENOUS | Status: DC
  Administered 2015-09-18: 1 m[IU]/min via INTRAVENOUS

## 2015-09-18 MED ORDER — SODIUM CHLORIDE 0.9 % IJ SOLN: 3.0000 mL | INTRAMUSCULAR | Status: DC | PRN

## 2015-09-18 MED ORDER — SODIUM CHLORIDE 0.9 % IJ SOLN: 3.0000 mL | Freq: Two times a day (BID) | INTRAMUSCULAR | Status: DC

## 2015-09-18 MED ORDER — MENTHOL 3 MG MT LOZG: 1.0000 | LOZENGE | OROMUCOSAL | Status: DC | PRN

## 2015-09-18 MED ORDER — WITCH HAZEL-GLYCERIN EX PADS: 1.0000 "application " | MEDICATED_PAD | CUTANEOUS | Status: DC | PRN

## 2015-09-18 MED ORDER — SCOPOLAMINE 1 MG/3DAYS TD PT72
MEDICATED_PATCH | TRANSDERMAL | Status: AC
  Filled 2015-09-18: qty 1

## 2015-09-18 MED ORDER — BISACODYL 10 MG RE SUPP: 10.0000 mg | Freq: Every day | RECTAL | Status: DC | PRN

## 2015-09-18 MED ORDER — EPHEDRINE 5 MG/ML INJ: 10.0000 mg | INTRAVENOUS | Status: DC | PRN

## 2015-09-18 MED ORDER — KETOROLAC TROMETHAMINE 30 MG/ML IJ SOLN: 30.0000 mg | Freq: Four times a day (QID) | INTRAMUSCULAR | Status: DC | PRN

## 2015-09-18 MED ORDER — FLEET ENEMA 7-19 GM/118ML RE ENEM: 1.0000 | ENEMA | Freq: Every day | RECTAL | Status: DC | PRN

## 2015-09-18 MED ORDER — KETOROLAC TROMETHAMINE 30 MG/ML IJ SOLN
30.0000 mg | Freq: Four times a day (QID) | INTRAMUSCULAR | Status: DC | PRN
  Administered 2015-09-18: 30 mg via INTRAMUSCULAR

## 2015-09-18 MED ORDER — ACETAMINOPHEN 325 MG PO TABS: 650.0000 mg | ORAL_TABLET | ORAL | Status: DC | PRN

## 2015-09-18 MED ORDER — CITRIC ACID-SODIUM CITRATE 334-500 MG/5ML PO SOLN
30.0000 mL | ORAL | Status: DC | PRN
  Administered 2015-09-18: 30 mL via ORAL
  Filled 2015-09-18: qty 15

## 2015-09-18 MED ORDER — LIDOCAINE HCL (PF) 1 % IJ SOLN
INTRAMUSCULAR | Status: DC | PRN
  Administered 2015-09-18: 5 mL
  Administered 2015-09-18: 4 mL

## 2015-09-18 MED ORDER — NALBUPHINE HCL 10 MG/ML IJ SOLN
5.0000 mg | INTRAMUSCULAR | Status: DC | PRN
  Administered 2015-09-18: 5 mg via SUBCUTANEOUS
  Filled 2015-09-18: qty 1

## 2015-09-18 MED ORDER — MORPHINE SULFATE (PF) 0.5 MG/ML IJ SOLN
INTRAMUSCULAR | Status: AC
  Filled 2015-09-18: qty 10

## 2015-09-18 MED ORDER — LACTATED RINGERS IV SOLN
INTRAVENOUS | Status: DC | PRN
  Administered 2015-09-18: 17:00:00 via INTRAVENOUS

## 2015-09-18 MED ORDER — IBUPROFEN 800 MG PO TABS
800.0000 mg | ORAL_TABLET | Freq: Three times a day (TID) | ORAL | Status: DC | PRN
  Administered 2015-09-20: 800 mg via ORAL
  Filled 2015-09-18 (×2): qty 1

## 2015-09-18 MED ORDER — OXYCODONE-ACETAMINOPHEN 5-325 MG PO TABS
1.0000 | ORAL_TABLET | ORAL | Status: DC | PRN
  Administered 2015-09-19 (×2): 1 via ORAL
  Filled 2015-09-18 (×4): qty 1

## 2015-09-18 MED ORDER — PENICILLIN G POTASSIUM 5000000 UNITS IJ SOLR
5.0000 10*6.[IU] | Freq: Once | INTRAMUSCULAR | Status: AC
  Administered 2015-09-18: 5 10*6.[IU] via INTRAVENOUS
  Filled 2015-09-18: qty 5

## 2015-09-18 MED ORDER — OXYCODONE-ACETAMINOPHEN 5-325 MG PO TABS: 2.0000 | ORAL_TABLET | ORAL | Status: DC | PRN

## 2015-09-18 MED ORDER — OXYTOCIN 10 UNIT/ML IJ SOLN
INTRAMUSCULAR | Status: AC
  Filled 2015-09-18: qty 4

## 2015-09-18 MED ORDER — ONDANSETRON HCL 4 MG/2ML IJ SOLN
INTRAMUSCULAR | Status: AC
  Filled 2015-09-18: qty 2

## 2015-09-18 MED ORDER — KETOROLAC TROMETHAMINE 30 MG/ML IJ SOLN
30.0000 mg | Freq: Four times a day (QID) | INTRAMUSCULAR | Status: AC | PRN
  Filled 2015-09-18: qty 1

## 2015-09-18 MED ORDER — ZOLPIDEM TARTRATE 5 MG PO TABS: 5.0000 mg | ORAL_TABLET | Freq: Every evening | ORAL | Status: DC | PRN

## 2015-09-18 MED ORDER — FENTANYL 2.5 MCG/ML BUPIVACAINE 1/10 % EPIDURAL INFUSION (WH - ANES)
14.0000 mL/h | INTRAMUSCULAR | Status: DC | PRN
  Administered 2015-09-18: 14 mL/h via EPIDURAL
  Filled 2015-09-18 (×2): qty 125

## 2015-09-18 MED ORDER — DIPHENHYDRAMINE HCL 50 MG/ML IJ SOLN
12.5000 mg | INTRAMUSCULAR | Status: DC | PRN
Start: 2015-09-18 — End: 2015-09-18

## 2015-09-18 MED ORDER — MEASLES, MUMPS & RUBELLA VAC ~~LOC~~ INJ
0.5000 mL | INJECTION | Freq: Once | SUBCUTANEOUS | Status: DC
  Filled 2015-09-18: qty 0.5

## 2015-09-18 MED ORDER — SODIUM CHLORIDE 0.9 % IR SOLN
Status: DC | PRN
  Administered 2015-09-18: 1000 mL

## 2015-09-18 MED ORDER — SENNOSIDES-DOCUSATE SODIUM 8.6-50 MG PO TABS
2.0000 | ORAL_TABLET | ORAL | Status: DC
  Administered 2015-09-19 – 2015-09-20 (×2): 2 via ORAL
  Filled 2015-09-18 (×3): qty 2

## 2015-09-18 MED ORDER — DIBUCAINE 1 % RE OINT: 1.0000 "application " | TOPICAL_OINTMENT | RECTAL | Status: DC | PRN

## 2015-09-18 MED ORDER — KETOROLAC TROMETHAMINE 30 MG/ML IJ SOLN
30.0000 mg | Freq: Four times a day (QID) | INTRAMUSCULAR | Status: AC | PRN
  Administered 2015-09-19: 30 mg via INTRAVENOUS

## 2015-09-18 MED ORDER — SODIUM CHLORIDE 0.9 % IV SOLN: 250.0000 mL | INTRAVENOUS | Status: DC

## 2015-09-18 MED ORDER — PHENYLEPHRINE 40 MCG/ML (10ML) SYRINGE FOR IV PUSH (FOR BLOOD PRESSURE SUPPORT)
PREFILLED_SYRINGE | INTRAVENOUS | Status: AC
  Filled 2015-09-18: qty 10

## 2015-09-18 MED ORDER — SCOPOLAMINE 1 MG/3DAYS TD PT72
1.0000 | MEDICATED_PATCH | Freq: Once | TRANSDERMAL | Status: DC
  Filled 2015-09-18: qty 1

## 2015-09-18 MED ORDER — LACTATED RINGERS IV BOLUS (SEPSIS)
500.0000 mL | Freq: Once | INTRAVENOUS | Status: AC
  Administered 2015-09-18: 500 mL via INTRAVENOUS

## 2015-09-18 MED ORDER — FLEET ENEMA 7-19 GM/118ML RE ENEM: 1.0000 | ENEMA | RECTAL | Status: DC | PRN

## 2015-09-18 SURGICAL SUPPLY — 31 items
BENZOIN TINCTURE PRP APPL 2/3 (GAUZE/BANDAGES/DRESSINGS) ×3 IMPLANT
CLAMP CORD UMBIL (MISCELLANEOUS) IMPLANT
CLOSURE STERI STRIP 1/2 X4 (GAUZE/BANDAGES/DRESSINGS) ×3 IMPLANT
CLOTH BEACON ORANGE TIMEOUT ST (SAFETY) ×3 IMPLANT
DRAPE SHEET LG 3/4 BI-LAMINATE (DRAPES) IMPLANT
DRSG OPSITE POSTOP 4X10 (GAUZE/BANDAGES/DRESSINGS) ×3 IMPLANT
DURAPREP 26ML APPLICATOR (WOUND CARE) ×3 IMPLANT
ELECT REM PT RETURN 9FT ADLT (ELECTROSURGICAL) ×3
ELECTRODE REM PT RTRN 9FT ADLT (ELECTROSURGICAL) ×1 IMPLANT
EXTRACTOR VACUUM M CUP 4 TUBE (SUCTIONS) IMPLANT
EXTRACTOR VACUUM M CUP 4' TUBE (SUCTIONS)
GLOVE BIO SURGEON STRL SZ7 (GLOVE) ×3 IMPLANT
GLOVE BIOGEL PI IND STRL 7.0 (GLOVE) ×1 IMPLANT
GLOVE BIOGEL PI INDICATOR 7.0 (GLOVE) ×2
GOWN STRL REUS W/TWL LRG LVL3 (GOWN DISPOSABLE) ×6 IMPLANT
KIT ABG SYR 3ML LUER SLIP (SYRINGE) IMPLANT
NEEDLE HYPO 25X5/8 SAFETYGLIDE (NEEDLE) ×3 IMPLANT
NS IRRIG 1000ML POUR BTL (IV SOLUTION) ×3 IMPLANT
PACK C SECTION WH (CUSTOM PROCEDURE TRAY) ×3 IMPLANT
PAD OB MATERNITY 4.3X12.25 (PERSONAL CARE ITEMS) ×3 IMPLANT
PENCIL SMOKE EVAC W/HOLSTER (ELECTROSURGICAL) ×3 IMPLANT
SPONGE LAP 18X18 X RAY DECT (DISPOSABLE) ×3 IMPLANT
STRIP CLOSURE SKIN 1/2X4 (GAUZE/BANDAGES/DRESSINGS) ×2 IMPLANT
SUT CHROMIC 0 CTX 36 (SUTURE) ×9 IMPLANT
SUT CHROMIC 1 CTX 36 (SUTURE) ×3 IMPLANT
SUT MON AB 4-0 PS1 27 (SUTURE) ×3 IMPLANT
SUT PDS AB 0 CT1 27 (SUTURE) ×6 IMPLANT
SUT VIC AB 3-0 CT1 27 (SUTURE) ×4
SUT VIC AB 3-0 CT1 TAPERPNT 27 (SUTURE) ×2 IMPLANT
TOWEL OR 17X24 6PK STRL BLUE (TOWEL DISPOSABLE) ×3 IMPLANT
TRAY FOLEY CATH SILVER 14FR (SET/KITS/TRAYS/PACK) ×3 IMPLANT

## 2015-09-18 NOTE — Progress Notes (Signed)
Received epid, PCN started>>now 4/C/AROM>>thin mec, FHR cat I

## 2015-09-18 NOTE — Anesthesia Postprocedure Evaluation (Signed)
Anesthesia Post Note  Patient: [name] [name]  Procedure(s) Performed: Procedure(s) (LRB): CESAREAN SECTION (N/A)  Patient location during evaluation: PACU Anesthesia Type: Epidural Level of consciousness: oriented and awake and alert Pain management: pain level controlled Vital Signs Assessment: post-procedure vital signs reviewed and stable Respiratory status: spontaneous breathing, respiratory function stable and patient connected to nasal cannula oxygen Cardiovascular status: blood pressure returned to baseline and stable (initially with BP with systolics around 90, slightly light headed, good uterine tone, 500cc more crystalloid given with good response, CBC sent and will check) Postop Assessment: No backache and No signs of nausea or vomiting Anesthetic complications: no    Last Vitals:  Filed Vitals:   09/18/15 1813 09/18/15 1814  BP:    Pulse: 100 103  Temp:    Resp: 17 18    Last Pain:  Filed Vitals:   09/18/15 1815  PainSc: 0-No pain                 Reino KentJudd, Naitik Hermann J

## 2015-09-18 NOTE — Progress Notes (Signed)
Still 4/C/-1>>>IUPC + ISE>>start pit aug protocol, stable FHR

## 2015-09-18 NOTE — Op Note (Signed)
Preoperative diagnosis: CPD  Postoperative diagnosis same plus OP presentation  Surgeon: Marcelle OverlieHolland  Procedure: Primary low transverse cesarean section  EBL: 700 cc  Drains: Foley, to straight drain  Procedure and findings:  The patient was taken the operating room after an adequate level of epidural anesthesia was obtained, and the left tilt position she was prepped and draped with a Foley catheter draining clear urine. `Traxi was used to elevate the panniculus with good lower abdominal exposure. Appropriate timeouts were taken at that point. Transverse incision made 2 finger breaths above the symphysis carried down to the fascia which was incised and extended transversely. Rectus muscle divided in the midline, peritoneum entered sparely without incident and extended in a vertical fashion. The vesicouterine serosa was then incised and bluntly and sharply dissected below, bladder blade repositioned. Transverse incision made in the lower segment extended with bandage scissors. Fetus was noted be straight OP presentation. The infant was suctioned cord clamped and passed the pediatric team for further care. Cord blood was obtained along with pH. Placenta was then delivered manually intact, sent to pathology, uterus exteriorized cavity wiped clean with laparotomy pack closure obtained the first layer of 0 chromic in a locked fashion followed by an imbricating layer of 0 chromic. This is hemostatic there were some filmy adhesions from the omentum to the top margin of the uterus both tubes and ovaries this was lysed in an avascular pelvic plane with the Bovie was hemostatic otherwise tubes and ovaries were relatively normal prior to closure sponge, needle, history counts reported as correct 2. Peritoneum closed with 3-0 Vicryl running suture same for the rectus muscles in the midline 0 PDS from laterally to midline on either side for the fascia septate stat was relatively thin was hemostatic 4-0 Monocryl  subarticular closure with honeycomb dressing she went to recovery room in good condition. Clear urine noted in the case  Meriel Picaichard M Shayleigh Bouldin M.D.

## 2015-09-18 NOTE — Progress Notes (Signed)
No change past 4 cm w/  adeq labor, discussed CPD>>rec CS>>proced + risks dicsussed

## 2015-09-18 NOTE — Anesthesia Preprocedure Evaluation (Signed)
Anesthesia Evaluation  Patient identified by MRN, date of birth, ID band  Reviewed: NPO status , Patient's Chart, lab work & pertinent test results  Airway Mallampati: II   Neck ROM: Full    Dental  (+) Dental Advisory Given   Pulmonary former smoker,    breath sounds clear to auscultation       Cardiovascular negative cardio ROS   Rhythm:Regular     Neuro/Psych    GI/Hepatic Neg liver ROS, GERD  ,  Endo/Other  Morbid obesity  Renal/GU negative Renal ROS  negative genitourinary   Musculoskeletal   Abdominal (+) + obese,   Peds negative pediatric ROS (+)  Hematology   Anesthesia Other Findings   Reproductive/Obstetrics (+) Pregnancy                             Anesthesia Physical Anesthesia Plan  ASA: III  Anesthesia Plan: Epidural   Post-op Pain Management:    Induction:   Airway Management Planned:   Additional Equipment:   Intra-op Plan:   Post-operative Plan:   Informed Consent: I have reviewed the patients History and Physical, chart, labs and discussed the procedure including the risks, benefits and alternatives for the proposed anesthesia with the patient or authorized representative who has indicated his/her understanding and acceptance.     Plan Discussed with:   Anesthesia Plan Comments:         Anesthesia Quick Evaluation

## 2015-09-18 NOTE — Transfer of Care (Signed)
Immediate Anesthesia Transfer of Care Note  Patient: [name] [name] ShowsMaleka L Strebel  Procedure(s) Performed: Procedure(s): CESAREAN SECTION (N/A)  Patient Location: PACU  Anesthesia Type:Epidural  Level of Consciousness: awake, alert , oriented and patient cooperative  Airway & Oxygen Therapy: Patient Spontanous Breathing  Post-op Assessment: Report given to RN and Post -op Vital signs reviewed and stable  Post vital signs: Reviewed and stable  Last Vitals:  Filed Vitals:   09/18/15 1602 09/18/15 1618  BP: 109/71 125/84  Pulse: 107 113  Temp:    Resp: 18 18    Complications: No apparent anesthesia complications

## 2015-09-18 NOTE — Anesthesia Procedure Notes (Signed)
Epidural Patient location during procedure: OB Start time: 09/18/2015 3:58 AM End time: 09/18/2015 4:20 AM  Staffing Anesthesiologist: Sebastian AcheMANNY, Rocio Wolak  Preanesthetic Checklist Completed: patient identified, site marked, surgical consent, pre-op evaluation, timeout performed, IV checked, risks and benefits discussed and monitors and equipment checked  Epidural Patient position: sitting Prep: site prepped and draped and DuraPrep Patient monitoring: heart rate, continuous pulse ox and blood pressure Approach: midline Location: L3-L4 Injection technique: LOR air  Needle:  Needle type: Tuohy  Needle gauge: 17 G Needle length: 9 cm and 9 Needle insertion depth: 7.5 cm Catheter type: closed end flexible Catheter size: 19 Gauge Catheter at skin depth: 14 cm Test dose: negative  Assessment Events: blood not aspirated, injection not painful, no injection resistance, negative IV test and no paresthesia  Additional Notes   Patient tolerated the insertion well without complications.Reason for block:procedure for pain

## 2015-09-18 NOTE — Consult Note (Signed)
Neonatology Note:   Attendance at C-section:    I was asked by Dr. Marcelle OverlieHolland to attend this elective C/S at term for FTP. The mother is a G1 who is GBS + with aIAP with otherwise reassuring labs except noted Rubella non-immune. ROM ~8h delivery, fluid light meconium. Infant vigorous with good spontaneous cry and tone. Needed only minimal bulb suctioning. Ap 8 and 9. Lungs clear to ausc in DR. Birthmark noted left inner thigh.To CN to care of Pediatrician.  Support lactation.   Jamie Brookesavid Ehrmann, MD

## 2015-09-18 NOTE — H&P (Signed)
[name] [name]:  [name], [name]             ACCOUNT NO.:  [id number]  MEDICAL RECORD NO.:  [id number]  LOCATION:  9168                          FACILITY:  [facility]  PHYSICIAN:  Duke Salviaichard M. Marcelle OverlieHolland, M.D.DATE OF BIRTH:  28-Nov-1995  DATE OF ADMISSION:  09/18/2015 DATE OF DISCHARGE:                             HISTORY & PHYSICAL   CHIEF COMPLAINT:  Post-dates labor induction.  HISTORY OF PRESENT ILLNESS:  A 19 year old, G1, P0, positive, GBS screen presents for 2 stage labor induction with an unfavorable cervix at her EDD.  She has a history of cholelithiasis and both her rubella and varicella are nonimmune, will require postpartum immunization.  Her 1- hour GTT result has been normal at 100.  No other abnormalities.  The protocol for Cytotec followed by Pitocin labor induction discussed with her which she consents to.  PAST MEDICAL HISTORY:  Please see the [facility] form for details.  PHYSICAL EXAM:  VITAL SIGNS:  Temp 98.2, blood pressure 130/78. HEENT:  Unremarkable. NECK:  Supple without masses. LUNGS:  Clear. CARDIOVASCULAR:  Regular rate and rhythm without murmurs, rubs, gallops. BREASTS:  Not examined. ABDOMEN:  Term fundal height.  Fetal heart rate 140, cervix was closed. Vertex. EXTREMITIES:  Unremarkable. NEUROLOGIC:  Unremarkable.  IMPRESSION:  Term pregnancy, unfavorable cervix for induction of labor per Cytotec/Pitocin protocol, positive GBS, will receive antibiotics in labor.     [name] [name] M. Marcelle OverlieHolland, M.D.     RMH/MEDQ  D:  09/17/2015  T:  09/17/2015  Job:  [id number]

## 2015-09-18 NOTE — Addendum Note (Signed)
Addendum  created 09/18/15 2142 by Val Eaglehristopher Alayasia Breeding, MD   Modules edited: Orders, PRL Based Order Sets

## 2015-09-19 LAB — CBC
HCT: 24.7 % — ABNORMAL LOW (ref 36.0–46.0)
Hemoglobin: 8.1 g/dL — ABNORMAL LOW (ref 12.0–15.0)
MCH: 27.2 pg (ref 26.0–34.0)
MCHC: 32.8 g/dL (ref 30.0–36.0)
MCV: 82.9 fL (ref 78.0–100.0)
PLATELETS: 207 10*3/uL (ref 150–400)
RBC: 2.98 MIL/uL — AB (ref 3.87–5.11)
RDW: 14.6 % (ref 11.5–15.5)
WBC: 14.6 10*3/uL — AB (ref 4.0–10.5)

## 2015-09-19 NOTE — Progress Notes (Signed)
md notified

## 2015-09-19 NOTE — Progress Notes (Signed)
Subjective: Postpartum Day 1: Cesarean Delivery Patient reports tolerating PO.    Objective: Vital signs in last 24 hours: Temp:  [97.6 F (36.4 C)-99 F (37.2 C)] 98.3 F (36.8 C) (11/24 0430) Pulse Rate:  [92-114] 96 (11/24 0430) Resp:  [15-37] 18 (11/24 0430) BP: (88-143)/(42-90) 126/64 mmHg (11/24 0430) SpO2:  [96 %-100 %] 98 % (11/24 0132)  Physical Exam:  General: alert, cooperative and no distress Lochia: appropriate Uterine Fundus: firm Incision: healing well DVT Evaluation: No evidence of DVT seen on physical exam.   Recent Labs  09/18/15 1825 09/19/15 0548  HGB 9.2* 8.1*  HCT 28.6* 24.7*    Assessment/Plan: Status post Cesarean section. Doing well postoperatively.  Continue current care.  Aniket Paye II,Donnelle Rubey E 09/19/2015, 7:57 AM

## 2015-09-19 NOTE — Anesthesia Postprocedure Evaluation (Signed)
Anesthesia Post Note  Patient: [name] [name]  Procedure(s) Performed: Procedure(s) (LRB): CESAREAN SECTION (N/A)  Patient location during evaluation: Mother Baby Anesthesia Type: Epidural Level of consciousness: awake and alert and oriented Pain management: pain level controlled Vital Signs Assessment: post-procedure vital signs reviewed and stable Respiratory status: spontaneous breathing Cardiovascular status: stable Postop Assessment: No headache, No backache, No signs of nausea or vomiting and Adequate PO intake Anesthetic complications: no    Last Vitals:  Filed Vitals:   09/18/15 2150 09/19/15 0430  BP: 124/60 126/64  Pulse: 95 96  Temp: 37.2 C 36.8 C  Resp: 18 18    Last Pain:  Filed Vitals:   09/19/15 0431  PainSc: 1                  Edison PaceWILKERSON,Keyarra Rendall

## 2015-09-19 NOTE — Progress Notes (Signed)
CTSP re: tachycardia Patient states pulse noted to be up last week and EKG normal No dizziness, no nausea, no CP, no SOB, no leg pain  Filed Vitals:   09/19/15 1843 09/19/15 1855  BP: 112/69 107/54  Pulse: 125 135  Temp: 98.8 F (37.1 C)   Resp: 18    patient sitting up eating in no distress Lungs CTA Cor about 120s, regular rate LE NT without cords  A/P: tachycardia, currently asymptomatic         Will observe, fluids, oral iron

## 2015-09-19 NOTE — Lactation Note (Signed)
This note was copied from the [name] Lactation Consultation Note; Initial visit with mom, baby now 7619 hours old. Mom reports baby has been feeding well but is sleepy a lot too. Reviewed normal newborn behavior the first 24 hours and cluster feeding the second night. Reviewed feeding cues and encouraged to feed whenever she sees them. Mom hasher own DEBP setup in room. Reports she pumped once and obtained a few drops. Encouraged frequent breast feeding to promote a good milk supply. BF brochure given with resources for support after DC. Reviewed BFSG and OP appointments. No questions at present. To call for assist prn Patient Name: [name] [name] Jonna CoupMaleka Lecker ZOXWR'UToday's Date: 09/19/2015 Reason for consult: Initial assessment   Maternal Data Formula Feeding for Exclusion: Yes Reason for exclusion: Mother's choice to formula and breast feed on admission Has patient been taught Hand Expression?: Yes Does the patient have breastfeeding experience prior to this delivery?: No  Feeding Feeding Type: Breast Fed Length of feed: 20 min  LATCH Score/Interventions Latch: Grasps breast easily, tongue down, lips flanged, rhythmical sucking.  Audible Swallowing: A few with stimulation  Type of Nipple: Everted at rest and after stimulation  Comfort (Breast/Nipple): Soft / non-tender     Hold (Positioning): Assistance needed to correctly position infant at breast and maintain latch. Intervention(s): Breastfeeding basics reviewed  LATCH Score: 8  Lactation Tools Discussed/Used     Consult Status Consult Status: Follow-up Date: 09/20/15 Follow-up type: In-patient    Pamelia HoitWeeks, Darris Staiger D 09/19/2015, 12:07 PM

## 2015-09-19 NOTE — Progress Notes (Signed)
MD notified regarding patients heart rate in 130's, pt is asymptomatic and sitting in chair eating dinner.  MD said he will come to bedside. Will cont. To monitor.

## 2015-09-19 NOTE — Addendum Note (Signed)
Addendum  created 09/19/15 0815 by Earmon PhoenixValerie P Westyn Keatley, CRNA   Modules edited: Clinical Notes   Clinical Notes:  File: [id number]

## 2015-09-20 MED ORDER — IBUPROFEN 800 MG PO TABS: 800.0000 mg | ORAL_TABLET | Freq: Three times a day (TID) | ORAL | Status: DC | PRN

## 2015-09-20 MED ORDER — OXYCODONE-ACETAMINOPHEN 5-325 MG PO TABS: 1.0000 | ORAL_TABLET | ORAL | Status: DC | PRN

## 2015-09-20 NOTE — Discharge Summary (Signed)
Obstetric Discharge Summary Reason for Admission: induction of labor Prenatal Procedures: ultrasound Intrapartum Procedures: cesarean: low cervical, transverse Postpartum Procedures: none Complications-Operative and Postpartum: none HEMOGLOBIN  Date Value Ref Range Status  09/19/2015 8.1* 12.0 - 15.0 g/dL Final   HCT  Date Value Ref Range Status  09/19/2015 24.7* 36.0 - 46.0 % Final    Physical Exam:  General: alert and cooperative Lochia: appropriate Uterine Fundus: firm Incision: healing well, no significant drainage DVT Evaluation: No evidence of DVT seen on physical exam.  Discharge Diagnoses: Term Pregnancy-delivered  Discharge Information: Date: 09/20/2015 Activity: pelvic rest Diet: routine Medications: PNV, Ibuprofen and Percocet Condition: stable Instructions: refer to practice specific booklet Discharge to: home Follow-up Information    Schedule an appointment as soon as possible for a visit in 2 weeks to follow up.      Newborn Data: Live born female  Birth Weight: 7 lb 10.1 oz (3460 g) APGAR: 8, 9  Home with mother.  [name] [name] 09/20/2015, 8:59 AM

## 2015-09-23 ENCOUNTER — Encounter (HOSPITAL_COMMUNITY): Payer: Self-pay | Admitting: Obstetrics and Gynecology

## 2015-12-12 ENCOUNTER — Ambulatory Visit: Payer: Medicaid Other | Admitting: Certified Nurse Midwife

## 2016-11-23 ENCOUNTER — Encounter (HOSPITAL_COMMUNITY): Payer: Self-pay | Admitting: *Deleted

## 2016-11-23 ENCOUNTER — Emergency Department (HOSPITAL_COMMUNITY)
Admission: EM | Admit: 2016-11-23 | Discharge: 2016-11-23 | Disposition: A | Discharge status: Home / Self Care | Payer: Medicaid Other | Attending: Emergency Medicine | Admitting: Emergency Medicine

## 2016-11-23 DIAGNOSIS — R69 Illness, unspecified: Secondary | ICD-10-CM

## 2016-11-23 DIAGNOSIS — J111 Influenza due to unidentified influenza virus with other respiratory manifestations: Secondary | ICD-10-CM | POA: Insufficient documentation

## 2016-11-23 DIAGNOSIS — F17200 Nicotine dependence, unspecified, uncomplicated: Secondary | ICD-10-CM | POA: Insufficient documentation

## 2016-11-23 MED ORDER — BENZONATATE 100 MG PO CAPS: 100.0000 mg | ORAL_CAPSULE | Freq: Three times a day (TID) | ORAL | 0 refills | Status: DC

## 2016-11-23 MED ORDER — KETOROLAC TROMETHAMINE 60 MG/2ML IM SOLN
60.0000 mg | Freq: Once | INTRAMUSCULAR | Status: AC
  Administered 2016-11-23: 60 mg via INTRAMUSCULAR
  Filled 2016-11-23: qty 2

## 2016-11-23 MED ORDER — ONDANSETRON 4 MG PO TBDP: 4.0000 mg | ORAL_TABLET | Freq: Three times a day (TID) | ORAL | 0 refills | Status: DC | PRN

## 2016-11-23 MED ORDER — KETOROLAC TROMETHAMINE 60 MG/2ML IM SOLN: 60.0000 mg | Freq: Once | INTRAMUSCULAR | Status: DC

## 2016-11-23 MED ORDER — IBUPROFEN 800 MG PO TABS: 800.0000 mg | ORAL_TABLET | Freq: Three times a day (TID) | ORAL | 0 refills | Status: DC

## 2016-11-23 MED ORDER — IBUPROFEN 400 MG PO TABS: 600.0000 mg | ORAL_TABLET | Freq: Once | ORAL | Status: DC

## 2016-11-23 NOTE — Discharge Instructions (Signed)
Your symptoms are consistent with a viral illness, such as influenza. Viruses do not require antibiotics. Treatment is symptomatic care and it is important to note that these symptoms may last for 7-14 days. Symptoms will be intensified and complicated by dehydration. Dehydration can also extend the duration of symptoms. Drink plenty of fluids and get plenty of rest. You should be drinking at least half a liter of water an hour to stay hydrated. Electrolyte drinks are also encouraged. You should be drinking enough fluids to make your urine light yellow, almost clear. If this is not the case, you are not drinking enough water. Ibuprofen, Naproxen, or Tylenol for pain or fever. Zofran for nausea. Tessalon for cough. Plain Mucinex or Sudafed may help relieve congestion. Saline sinus rinses and saline nasal sprays may also help relieve congestion. Warm liquids or Chloraseptic spray may help soothe a sore throat. Follow up with a primary care provider, as needed, for any future management of this issue.

## 2016-11-23 NOTE — ED Provider Notes (Signed)
[facility]-EMER[facility]ENCY DEPT Provider Note   CSN: [id number] Arrival date & time: 11/23/16  0518     History   Chief Complaint Chief Complaint  Patient presents with  . Cough  . Fever  . Chills    HPI [name] [name] is a 21 y.o. female.  HPI   [name] [name] is a 20 y.o. female, with a history of GERD, presenting to the ED with nonproductive cough, congestion, fever, chills, and body aches for the last 3-4 days. She has not taken any medications for her symptoms. Pain is mild to moderate. Endorses poor fluid intake. Denies shortness of breath, N/V/D, abdominal pain, or any other complaints.   She has not received influenza immunization.  Past Medical History:  Diagnosis Date  . GERD (gastroesophageal reflux disease)   . Medical history non-contributory     Patient Active Problem List   Diagnosis Date Noted  . Pregnancy 09/18/2015    Past Surgical History:  Procedure Laterality Date  . CESAREAN SECTION N/A 09/18/2015   Procedure: CESAREAN SECTION;  Surgeon: Richarda Overlie, MD;  Location: [facility];  Service: Obstetrics;  Laterality: N/A;  . NO PAST SURGERIES      OB History    Gravida Para Term Preterm AB Living   1 1 1     1    SAB TAB Ectopic Multiple Live Births         0 1       Home Medications    Prior to Admission medications   Medication Sig Start Date End Date Taking? Authorizing Provider  benzonatate (TESSALON) 100 MG capsule Take 1 capsule (100 mg total) by mouth every 8 (eight) hours. 11/23/16   Temia Debroux C Lisa Milian, PA-C  ibuprofen (ADVIL,MOTRIN) 800 MG tablet Take 1 tablet (800 mg total) by mouth every 8 (eight) hours as needed for moderate pain. Patient not taking: Reported on 11/23/2016 09/20/15   Zelphia Cairo, MD  ibuprofen (ADVIL,MOTRIN) 800 MG tablet Take 1 tablet (800 mg total) by mouth 3 (three) times daily. 11/23/16   Audia Amick C Steele Ledonne, PA-C  ondansetron (ZOFRAN ODT) 4 MG disintegrating tablet Take 1 tablet (4 mg total) by mouth every 8 (eight) hours as  needed for nausea or vomiting. 11/23/16   Arrie Borrelli C Chennel Olivos, PA-C  oxyCODONE-acetaminophen (PERCOCET/ROXICET) 5-325 MG tablet Take 1 tablet by mouth every 4 (four) hours as needed (for pain scale 4-7). Patient not taking: Reported on 11/23/2016 09/20/15   Zelphia Cairo, MD  ranitidine (ZANTAC) 150 MG tablet Take 1 tablet (150 mg total) by mouth 2 (two) times daily. Patient not taking: Reported on 11/23/2016 06/01/15   Hurshel Party, CNM    Family History Family History  Problem Relation Age of Onset  . Diabetes Maternal Uncle   . Hypertension Maternal Uncle   . Heart disease Maternal Uncle     Social History Social History  Substance Use Topics  . Smoking status: Light Tobacco Smoker    Last attempt to quit: 01/14/2015  . Smokeless tobacco: Never Used  . Alcohol use Yes     Comment: not while rpegnant     Allergies   Shellfish allergy   Review of Systems Review of Systems  Constitutional: Positive for chills and fever.  HENT: Positive for sore throat.   Respiratory: Positive for cough. Negative for shortness of breath.   Gastrointestinal: Negative for abdominal pain, nausea and vomiting.  Musculoskeletal: Positive for myalgias.  All other systems reviewed and are negative.    Physical Exam Updated  Vital Signs BP 146/82   Pulse 114   Temp 99.3 F (37.4 C) (Oral)   Resp 18   Ht 5' (1.524 m)   Wt 117.9 kg   LMP 11/16/2016   SpO2 100%   BMI 50.78 kg/m   Physical Exam  Constitutional: She appears well-developed and well-nourished. No distress.  HENT:  Head: Normocephalic and atraumatic.  Mouth/Throat: Oropharynx is clear and moist.  Eyes: Conjunctivae are normal.  Neck: Neck supple.  Cardiovascular: Normal rate, regular rhythm, normal heart sounds and intact distal pulses.   Pulmonary/Chest: Effort normal and breath sounds normal. No respiratory distress.  Abdominal: Soft. There is no tenderness. There is no guarding.  Musculoskeletal: She exhibits no edema.    Lymphadenopathy:    She has no cervical adenopathy.  Neurological: She is alert.  Skin: Skin is warm and dry. She is not diaphoretic.  Psychiatric: She has a normal mood and affect. Her behavior is normal.  Nursing note and vitals reviewed.    ED Treatments / Results  Labs (all labs ordered are listed, but only abnormal results are displayed) Labs Reviewed - No data to display  EKG  EKG Interpretation None       Radiology No results found.  Procedures Procedures (including critical care time)  Medications Ordered in ED Medications  ketorolac (TORADOL) injection 60 mg (60 mg Intramuscular Given 11/23/16 0819)     Initial Impression / Assessment and Plan / ED Course  I have reviewed the triage vital signs and the nursing notes.  Pertinent labs & imaging results that were available during my care of the patient were reviewed by me and considered in my medical decision making (see chart for details).      Patient presents with influenza-like symptoms. Nontoxic appearing. Suspect minor dehydration contributing to the patient's symptoms and tachycardia. Doubt sepsis. Change in blood pressure noted. RN states patient was initially very anxious, but has since calmed down. Patient states she feels much better just prior to discharge that she did when she came in. PCP follow-up as needed. Supportive care and return precautions discussed. Patient voiced understanding of all instructions and is comfortable discharge.   Vitals:   11/23/16 0524 11/23/16 0617 11/23/16 0618 11/23/16 0808  BP: 152/89 146/82  119/57  Pulse: 114  114 114  Resp: 18   16  Temp: 99.3 F (37.4 C)   101.7 F (38.7 C)  TempSrc: Oral   Oral  SpO2: 99%  100% 99%  Weight: 117.9 kg     Height: 5' (1.524 m)      Vitals:   11/23/16 [id number] 11/23/16 0618 11/23/16 0808 11/23/16 0858  BP: 146/82  119/57 103/62  Pulse:  114 114 103  Resp:   16   Temp:   101.7 F (38.7 C) 100.3 F (37.9 C)  TempSrc:   Oral  Oral  SpO2:  100% 99% 100%  Weight:      Height:         Final Clinical Impressions(s) / ED Diagnoses   Final diagnoses:  Influenza-like illness    New Prescriptions Discharge Medication List as of 11/23/2016  8:05 AM    START taking these medications   Details  benzonatate (TESSALON) 100 MG capsule Take 1 capsule (100 mg total) by mouth every 8 (eight) hours., Starting Mon 11/23/2016, Print    !! ibuprofen (ADVIL,MOTRIN) 800 MG tablet Take 1 tablet (800 mg total) by mouth 3 (three) times daily., Starting Mon 11/23/2016, Print    ondansetron (  ZOFRAN ODT) 4 MG disintegrating tablet Take 1 tablet (4 mg total) by mouth every 8 (eight) hours as needed for nausea or vomiting., Starting Mon 11/23/2016, Print     !! - Potential duplicate medications found. Please discuss with provider.       Anselm Pancoast, PA-C 11/23/16 1118    Cathren Laine, MD 11/23/16 [phone]

## 2016-11-23 NOTE — ED Triage Notes (Signed)
C/o generalized body aches  Onset Fri. C/o fever and chills, states she just hurts all over

## 2017-02-08 ENCOUNTER — Emergency Department (HOSPITAL_COMMUNITY)
Admission: EM | Admit: 2017-02-08 | Discharge: 2017-02-08 | Disposition: A | Discharge status: Home / Self Care | Payer: Medicaid Other | Attending: Emergency Medicine | Admitting: Emergency Medicine

## 2017-02-08 ENCOUNTER — Emergency Department (HOSPITAL_COMMUNITY): Payer: Medicaid Other

## 2017-02-08 ENCOUNTER — Encounter (HOSPITAL_COMMUNITY): Payer: Self-pay | Admitting: Emergency Medicine

## 2017-02-08 DIAGNOSIS — Z79899 Other long term (current) drug therapy: Secondary | ICD-10-CM | POA: Diagnosis not present

## 2017-02-08 DIAGNOSIS — K8020 Calculus of gallbladder without cholecystitis without obstruction: Secondary | ICD-10-CM

## 2017-02-08 DIAGNOSIS — F17200 Nicotine dependence, unspecified, uncomplicated: Secondary | ICD-10-CM | POA: Insufficient documentation

## 2017-02-08 DIAGNOSIS — R1013 Epigastric pain: Secondary | ICD-10-CM | POA: Diagnosis present

## 2017-02-08 DIAGNOSIS — R945 Abnormal results of liver function studies: Secondary | ICD-10-CM | POA: Diagnosis not present

## 2017-02-08 DIAGNOSIS — R109 Unspecified abdominal pain: Secondary | ICD-10-CM

## 2017-02-08 DIAGNOSIS — R748 Abnormal levels of other serum enzymes: Secondary | ICD-10-CM

## 2017-02-08 LAB — COMPREHENSIVE METABOLIC PANEL
ALK PHOS: 138 U/L — AB (ref 38–126)
ALT: 111 U/L — AB (ref 14–54)
AST: 150 U/L — AB (ref 15–41)
Albumin: 3.7 g/dL (ref 3.5–5.0)
Anion gap: 6 (ref 5–15)
BUN: 7 mg/dL (ref 6–20)
CALCIUM: 9.1 mg/dL (ref 8.9–10.3)
CO2: 25 mmol/L (ref 22–32)
CREATININE: 0.71 mg/dL (ref 0.44–1.00)
Chloride: 106 mmol/L (ref 101–111)
Glucose, Bld: 86 mg/dL (ref 65–99)
Potassium: 4 mmol/L (ref 3.5–5.1)
Sodium: 137 mmol/L (ref 135–145)
Total Bilirubin: 0.8 mg/dL (ref 0.3–1.2)
Total Protein: 7.1 g/dL (ref 6.5–8.1)

## 2017-02-08 LAB — POC URINE PREG, ED: Preg Test, Ur: NEGATIVE

## 2017-02-08 LAB — URINALYSIS, ROUTINE W REFLEX MICROSCOPIC
BILIRUBIN URINE: NEGATIVE
Bacteria, UA: NONE SEEN
Glucose, UA: NEGATIVE mg/dL
HGB URINE DIPSTICK: NEGATIVE
Ketones, ur: NEGATIVE mg/dL
NITRITE: NEGATIVE
PH: 5 (ref 5.0–8.0)
Protein, ur: NEGATIVE mg/dL
SPECIFIC GRAVITY, URINE: 1.02 (ref 1.005–1.030)

## 2017-02-08 LAB — CBC WITH DIFFERENTIAL/PLATELET
BASOS PCT: 0 %
Basophils Absolute: 0 10*3/uL (ref 0.0–0.1)
EOS ABS: 0.1 10*3/uL (ref 0.0–0.7)
EOS PCT: 1 %
HCT: 39 % (ref 36.0–46.0)
HEMOGLOBIN: 12.7 g/dL (ref 12.0–15.0)
LYMPHS ABS: 1.6 10*3/uL (ref 0.7–4.0)
Lymphocytes Relative: 23 %
MCH: 27.4 pg (ref 26.0–34.0)
MCHC: 32.6 g/dL (ref 30.0–36.0)
MCV: 84.1 fL (ref 78.0–100.0)
MONOS PCT: 9 %
Monocytes Absolute: 0.7 10*3/uL (ref 0.1–1.0)
Neutro Abs: 4.7 10*3/uL (ref 1.7–7.7)
Neutrophils Relative %: 67 %
PLATELETS: 249 10*3/uL (ref 150–400)
RBC: 4.64 MIL/uL (ref 3.87–5.11)
RDW: 16.3 % — AB (ref 11.5–15.5)
WBC: 7 10*3/uL (ref 4.0–10.5)

## 2017-02-08 LAB — LIPASE, BLOOD: LIPASE: 16 U/L (ref 11–51)

## 2017-02-08 MED ORDER — GI COCKTAIL ~~LOC~~
30.0000 mL | Freq: Once | ORAL | Status: AC
  Administered 2017-02-08: 30 mL via ORAL
  Filled 2017-02-08: qty 30

## 2017-02-08 NOTE — ED Notes (Signed)
Pt is in U/S.

## 2017-02-08 NOTE — Discharge Instructions (Signed)
Please call the surgery clinic listed to schedule a follow-up appointment. Return to ER for fever, vomiting, new or worsening symptoms, any additional concerns.

## 2017-02-08 NOTE — ED Notes (Signed)
Pt is in stable condition upon d/c and ambulates from ED. 

## 2017-02-08 NOTE — ED Triage Notes (Signed)
Pt reports epigastric sharp pains that have been coming and going since Saturday. Pt reports some nausea but no vomiting or diarrhea. Pt also has mid lower back pain that feels like a cramp.

## 2017-02-08 NOTE — ED Notes (Signed)
Patient returned from US.

## 2017-02-08 NOTE — ED Provider Notes (Signed)
[facility] Provider Note   CSN: [id number] Arrival date & time: 02/08/17  9563     History   Chief Complaint Chief Complaint  Patient presents with  . Abdominal Pain    HPI [name] [name] is a 21 y.o. female.  The history is provided by the patient and medical records. No language interpreter was used.  Abdominal Pain   Associated symptoms include nausea.   [name] [name] is a 21 y.o. female  with a PMH of GERD who presents to the Emergency Department complaining of epigastric Pain which began 2 nights ago. Patient states that pain came on suddenly and was described as a sharp pain that took her breath away. Pain lasted for about 4-5 hours and then completely subsided. She awoke yesterday in her usual state of health with no pain or complaints. She had a typical day. This morning, upper abdominal pain returned much worse than previous. Still sharp in nature and constant for approximately 2 hours. She mixed apple cider vinegar and lemon juice together, warming him up and then drink it, and symptoms improved about 20 or 30 minutes later. Currently, she is having some discomfort, but very little and feels as if her symptoms aren't resolving. She states she had a history of similar symptoms when she was pregnant 2 years ago and thought to be her gallbladder. No other medications taken prior to arrival for symptoms. + nausea. No associated chest pain, shortness of breath, diaphoresis, jaw pain, back pain, urinary urgency/frequency, dysuria, vaginal discharge, vomiting, diarrhea, or constipation.   Past Medical History:  Diagnosis Date  . GERD (gastroesophageal reflux disease)   . Medical history non-contributory     Patient Active Problem List   Diagnosis Date Noted  . Pregnancy 09/18/2015    Past Surgical History:  Procedure Laterality Date  . CESAREAN SECTION N/A 09/18/2015   Procedure: CESAREAN SECTION;  Surgeon: Richarda Overlie, MD;  Location: [facility]S;  Service:  Obstetrics;  Laterality: N/A;  . NO PAST SURGERIES      OB History    Gravida Para Term Preterm AB Living   SAB TAB Ectopic Multiple Live Births         0 1       Home Medications    Prior to Admission medications   Medication Sig Start Date End Date Taking? Authorizing Provider  benzonatate (TESSALON) 100 MG capsule Take 1 capsule (100 mg total) by mouth every 8 (eight) hours. 11/23/16   Shawn C Joy, PA-C  ibuprofen (ADVIL,MOTRIN) 800 MG tablet Take 1 tablet (800 mg total) by mouth every 8 (eight) hours as needed for moderate pain. Patient not taking: Reported on 11/23/2016 09/20/15   Zelphia Cairo, MD  ibuprofen (ADVIL,MOTRIN) 800 MG tablet Take 1 tablet (800 mg total) by mouth 3 (three) times daily. 11/23/16   Shawn C Joy, PA-C  ondansetron (ZOFRAN ODT) 4 MG disintegrating tablet Take 1 tablet (4 mg total) by mouth every 8 (eight) hours as needed for nausea or vomiting. 11/23/16   Shawn C Joy, PA-C  oxyCODONE-acetaminophen (PERCOCET/ROXICET) 5-325 MG tablet Take 1 tablet by mouth every 4 (four) hours as needed (for pain scale 4-7). Patient not taking: Reported on 11/23/2016 09/20/15   Zelphia Cairo, MD  ranitidine (ZANTAC) 150 MG tablet Take 1 tablet (150 mg total) by mouth 2 (two) times daily. Patient not taking: Reported on 11/23/2016 06/01/15   Hurshel Party, CNM  Family History Family History  Problem Relation Age of Onset  . Diabetes Maternal Uncle   . Hypertension Maternal Uncle   . Heart disease Maternal Uncle     Social History Social History  Substance Use Topics  . Smoking status: Light Tobacco Smoker    Last attempt to quit: 01/14/2015  . Smokeless tobacco: Never Used  . Alcohol use Yes     Comment: not while rpegnant     Allergies   Shellfish allergy   Review of Systems Review of Systems  Gastrointestinal: Positive for abdominal pain and nausea.  All other systems reviewed and are negative.    Physical Exam Updated Vital  Signs BP 124/88 (BP Location: Right Arm)   Pulse 82   Temp 97.8 F (36.6 C) (Oral)   Resp 16   Ht  (1.626 m)   Wt 117.9 kg   LMP 01/08/2017   SpO2 100%   BMI 44.63 kg/m   Physical Exam  Constitutional: She is oriented to person, place, and time. She appears well-developed and well-nourished. No distress.  HENT:  Head: Normocephalic and atraumatic.  Cardiovascular: Normal rate, regular rhythm and normal heart sounds.   No murmur heard. Pulmonary/Chest: Effort normal and breath sounds normal. No respiratory distress.  Abdominal: Soft. Bowel sounds are normal. She exhibits no distension. There is tenderness. There is no rebound, no guarding and negative Murphy's sign.    Musculoskeletal: She exhibits no edema.  Neurological: She is alert and oriented to person, place, and time.  Skin: Skin is warm and dry.  Nursing note and vitals reviewed.    ED Treatments / Results  Labs (all labs ordered are listed, but only abnormal results are displayed) Labs Reviewed  URINALYSIS, ROUTINE W REFLEX MICROSCOPIC - Abnormal; Notable for the following:       Result Value   Leukocytes, UA TRACE (*)    Squamous Epithelial / LPF 0-5 (*)    All other components within normal limits  COMPREHENSIVE METABOLIC PANEL - Abnormal; Notable for the following:    AST 150 (*)    ALT 111 (*)    Alkaline Phosphatase 138 (*)    All other components within normal limits  CBC WITH DIFFERENTIAL/PLATELET - Abnormal; Notable for the following:    RDW 16.3 (*)    All other components within normal limits  LIPASE, BLOOD  POC URINE PREG, ED    EKG  EKG Interpretation None       Radiology US Abdomen Limited Ruq  Result Date: 02/08/2017 CLINICAL DATA:  Mid abdominal pain for 2 days with increased severity this morning. EXAM: US ABDOMEN LIMITED - RIGHT UPPER QUADRANT COMPARISON:  06/01/2015. FINDINGS: Gallbladder: There are shadowing echogenic stones measuring up to 1.4 cm. Gallbladder wall measures  2 mm. Negative sonographic Murphy sign. Common bile duct: Diameter: 2 mm, within normal limits. Liver: No focal lesion identified. Within normal limits in parenchymal echogenicity. IMPRESSION: Cholelithiasis.  No evidence of acute cholecystitis. Electronically Signed   By: Leanna Battles M.D.   On: 02/08/2017 12:29    Procedures Procedures (including critical care time)  Medications Ordered in ED Medications  gi cocktail (Maalox,Lidocaine,Donnatal) (30 mLs Oral Given 02/08/17 1225)     Initial Impression / Assessment and Plan / ED Course  I have reviewed the triage vital signs and the nursing notes.  Pertinent labs & imaging results that were available during my care of the patient were reviewed by me and considered in my medical decision making (see chart  for details).    [name] [name] is a 21 y.o. female who presents to ED for epigastric abdominal pain. On exam, patient is Nontoxic appearing, hemodynamically stable with tenderness to palpation of the epigastrium and mildly tender to right upper quadrant. Labs reviewed: Elevated liver labs: AST 150, AST 111, alkaline phosphatase 138. Bili normal at 0.8. Patient does not drink regularly. White count normal. No fevers. UA does not appear infectious. Ultrasound shows gallstones with no evidence of acute cholecystitis. Pain improved with GI cocktail. Patient tolerating PO. She feels comfortable going home with GenSurg follow up. Return precautions discussed, specifically, fevers, vomiting, new/worsening abdominal pain, any other concerns. All questions answered.   Final Clinical Impressions(s) / ED Diagnoses   Final diagnoses:  Abdominal pain  Calculus of gallbladder without cholecystitis without obstruction  Elevated liver enzymes    New Prescriptions New Prescriptions   No medications on file      [facility], PA-C 02/08/17 1320    Arby Barrette, MD 02/09/17 1718

## 2017-03-16 ENCOUNTER — Ambulatory Visit: Payer: Medicaid Other | Admitting: Obstetrics & Gynecology

## 2017-03-29 ENCOUNTER — Ambulatory Visit (INDEPENDENT_AMBULATORY_CARE_PROVIDER_SITE_OTHER): Payer: Medicaid Other | Admitting: Obstetrics

## 2017-03-29 ENCOUNTER — Other Ambulatory Visit (HOSPITAL_COMMUNITY)
Admission: RE | Admit: 2017-03-29 | Discharge: 2017-03-29 | Disposition: A | Discharge status: Home / Self Care | Payer: Medicaid Other | Source: Ambulatory Visit | Attending: Obstetrics | Admitting: Obstetrics

## 2017-03-29 ENCOUNTER — Encounter: Payer: Self-pay | Admitting: Obstetrics

## 2017-03-29 VITALS — BP 130/93 | HR 81 | Ht 60.0 in | Wt 259.9 lb

## 2017-03-29 DIAGNOSIS — Z3046 Encounter for surveillance of implantable subdermal contraceptive: Secondary | ICD-10-CM

## 2017-03-29 DIAGNOSIS — Z124 Encounter for screening for malignant neoplasm of cervix: Secondary | ICD-10-CM

## 2017-03-29 DIAGNOSIS — Z0000 Encounter for general adult medical examination without abnormal findings: Secondary | ICD-10-CM | POA: Diagnosis not present

## 2017-03-29 DIAGNOSIS — N898 Other specified noninflammatory disorders of vagina: Secondary | ICD-10-CM | POA: Diagnosis not present

## 2017-03-29 DIAGNOSIS — Z113 Encounter for screening for infections with a predominantly sexual mode of transmission: Secondary | ICD-10-CM

## 2017-03-29 DIAGNOSIS — Z01419 Encounter for gynecological examination (general) (routine) without abnormal findings: Secondary | ICD-10-CM | POA: Diagnosis not present

## 2017-03-29 NOTE — Progress Notes (Signed)
Subjective:        [name] [name] is a 21 y.o. female here for a routine exam.  Current complaints: Possible STD exposure.  Requests STD testing.  Has had some vaginal irritation and discharge.  Personal health questionnaire:  Is patient Ashkenazi Jewish, have a family history of breast and/or ovarian cancer: no Is there a family history of uterine cancer diagnosed at age < 5050, gastrointestinal cancer, urinary tract cancer, family member who is a Personnel officerLynch syndrome-associated carrier: no Is the patient overweight and hypertensive, family history of diabetes, personal history of gestational diabetes, preeclampsia or PCOS: no Is patient over 1655, have PCOS,  family history of premature CHD under age 23, diabetes, smoke, have hypertension or peripheral artery disease:  no At any time, has a partner hit, kicked or otherwise hurt or frightened you?: no Over the past 2 weeks, have you felt down, depressed or hopeless?: no Over the past 2 weeks, have you felt little interest or pleasure in doing things?:no   Gynecologic History Patient's last menstrual period was 01/28/2017 (approximate). Contraception: Nexplanon Last Pap: 2017. Results were: normal Last mammogram: n/a. Results were: n/a  Obstetric History OB History  Gravida Para Term Preterm AB Living  1 1 1     1   SAB TAB Ectopic Multiple Live Births        0 1    # Outcome Date GA Lbr Len/2nd Weight Sex Delivery Anes PTL Lv  1 Term 09/18/15 [id number]  7 lb 10.1 oz (3.46 kg) M CS-LTranv EPI  LIV     Birth Comments: wnl and c/w GA      Past Medical History:  Diagnosis Date  . Gall bladder inflammation   . GERD (gastroesophageal reflux disease)   . Medical history non-contributory     Past Surgical History:  Procedure Laterality Date  . CESAREAN SECTION N/A 09/18/2015   Procedure: CESAREAN SECTION;  Surgeon: Richarda Overlieichard [name], MD;  Location: [facility];  Service: Obstetrics;  Laterality: N/A;  . MOUTH SURGERY    . NO PAST SURGERIES        Current Outpatient Prescriptions:  .  ibuprofen (ADVIL,MOTRIN) 800 MG tablet, Take 1 tablet (800 mg total) by mouth 3 (three) times daily., Disp: 21 tablet, Rfl: 0 Allergies  Allergen Reactions  . Bee Venom   . Shellfish Allergy Hives    Social History  Substance Use Topics  . Smoking status: Light Tobacco Smoker    Types: Cigarettes    Last attempt to quit: 01/14/2015  . Smokeless tobacco: Never Used  . Alcohol use Yes    Family History  Problem Relation Age of Onset  . Diabetes Maternal Uncle   . Hypertension Maternal Uncle   . Heart disease Maternal Uncle       Review of Systems  Constitutional: negative for fatigue and weight loss Respiratory: negative for cough and wheezing Cardiovascular: negative for chest pain, fatigue and palpitations Gastrointestinal: negative for abdominal pain and change in bowel habits Musculoskeletal:negative for myalgias Neurological: negative for gait problems and tremors Behavioral/Psych: negative for abusive relationship, depression Endocrine: negative for temperature intolerance    Genitourinary: positive for vaginal discharge and irritation Integument/breast: negative for breast lump, breast tenderness, nipple discharge and skin lesion(s)    Objective:       BP (!) 130/93   Pulse 81   Ht 5' (1.524 m)   Wt 259 lb 14.4 oz (117.9 kg)   LMP 01/28/2017 (Approximate) Comment: intermiitant cycles  Breastfeeding? No  BMI 50.76 kg/m  General:   alert  Skin:   no rash or abnormalities  Lungs:   clear to auscultation bilaterally  Heart:   regular rate and rhythm, S1, S2 normal, no murmur, click, rub or gallop  Breasts:   normal without suspicious masses, skin or nipple changes or axillary nodes  Abdomen:  normal findings: no organomegaly, soft, non-tender and no hernia  Pelvis:  External genitalia: normal general appearance Urinary system: urethral meatus normal and bladder without fullness, nontender Vaginal: normal without  tenderness, induration or masses Cervix: normal appearance Adnexa: normal bimanual exam Uterus: anteverted and non-tender, normal size   Lab Review Urine pregnancy test Labs reviewed yes Radiologic studies reviewed no  50% of 20 min visit spent on counseling and coordination of care.    Assessment:    Healthy female exam.    Contraceptive Surveillance.  Pleased with Nexplanon.   Plan:    Education reviewed: calcium supplements, depression evaluation, low fat, low cholesterol diet, safe sex/STD prevention, self breast exams, smoking cessation and weight bearing exercise. Contraception: Nexplanon. Follow up in: 1 years.   No orders of the defined types were placed in this encounter.  No orders of the defined types were placed in this encounter.    Patient ID: [name] [name], female   DOB: 1996/02/27, 21 y.o.   MRN: [id number]

## 2017-03-30 LAB — CERVICOVAGINAL ANCILLARY ONLY
Bacterial vaginitis: NEGATIVE
CANDIDA VAGINITIS: NEGATIVE
CHLAMYDIA, DNA PROBE: NEGATIVE
NEISSERIA GONORRHEA: NEGATIVE
TRICH (WINDOWPATH): POSITIVE — AB

## 2017-03-30 LAB — CYTOLOGY - PAP: Diagnosis: NEGATIVE

## 2017-03-30 LAB — HEPATITIS C ANTIBODY: Hep C Virus Ab: <0.1 s/co ratio (ref 0.0–0.9)

## 2017-03-30 LAB — HEPATITIS B SURFACE ANTIGEN: Hepatitis B Surface Ag: NEGATIVE

## 2017-03-30 LAB — HIV ANTIBODY (ROUTINE TESTING W REFLEX): HIV SCREEN 4TH GENERATION: NONREACTIVE

## 2017-03-30 LAB — RPR: RPR Ser Ql: NONREACTIVE

## 2017-03-31 ENCOUNTER — Other Ambulatory Visit: Payer: Self-pay | Admitting: Obstetrics

## 2017-03-31 DIAGNOSIS — A599 Trichomoniasis, unspecified: Secondary | ICD-10-CM

## 2017-03-31 MED ORDER — METRONIDAZOLE 500 MG PO TABS: 2000.0000 mg | ORAL_TABLET | Freq: Once | ORAL | 0 refills | Status: AC

## 2017-06-24 ENCOUNTER — Ambulatory Visit (INDEPENDENT_AMBULATORY_CARE_PROVIDER_SITE_OTHER): Payer: Medicaid Other | Admitting: Obstetrics

## 2017-06-24 ENCOUNTER — Other Ambulatory Visit (HOSPITAL_COMMUNITY)
Admission: RE | Admit: 2017-06-24 | Discharge: 2017-06-24 | Disposition: A | Discharge status: Home / Self Care | Payer: Medicaid Other | Source: Ambulatory Visit | Attending: Obstetrics | Admitting: Obstetrics

## 2017-06-24 ENCOUNTER — Encounter: Payer: Self-pay | Admitting: Obstetrics

## 2017-06-24 VITALS — BP 121/85 | HR 95 | Ht 61.0 in | Wt 247.7 lb

## 2017-06-24 DIAGNOSIS — Z113 Encounter for screening for infections with a predominantly sexual mode of transmission: Secondary | ICD-10-CM | POA: Diagnosis not present

## 2017-06-24 DIAGNOSIS — N898 Other specified noninflammatory disorders of vagina: Secondary | ICD-10-CM | POA: Diagnosis not present

## 2017-06-24 NOTE — Progress Notes (Signed)
Patient ID: [name] [name], female   DOB: 11/24/1995, 21 y.o.   MRN: [id number]  Chief Complaint  Patient presents with  . Follow-up    Test of Cure    HPI [name] [name] is a 21 y.o. female.  History of positive Trichomonas, treated.  Presents for TOC cultures.  Complains of vaginal irritation. HPI  Past Medical History:  Diagnosis Date  . Gall bladder inflammation   . GERD (gastroesophageal reflux disease)   . Medical history non-contributory     Past Surgical History:  Procedure Laterality Date  . CESAREAN SECTION N/A 09/18/2015   Procedure: CESAREAN SECTION;  Surgeon: Richarda Overlieichard [name], MD;  Location: [facility];  Service: Obstetrics;  Laterality: N/A;  . MOUTH SURGERY    . NO PAST SURGERIES      Family History  Problem Relation Age of Onset  . Diabetes Maternal Uncle   . Hypertension Maternal Uncle   . Heart disease Maternal Uncle     Social History Social History  Substance Use Topics  . Smoking status: Light Tobacco Smoker    Types: Cigarettes    Last attempt to quit: 01/14/2015  . Smokeless tobacco: Never Used  . Alcohol use Yes    Allergies  Allergen Reactions  . Bee Venom   . Shellfish Allergy Hives    Current Outpatient Prescriptions  Medication Sig Dispense Refill  . ibuprofen (ADVIL,MOTRIN) 800 MG tablet Take 1 tablet (800 mg total) by mouth 3 (three) times daily. (Patient not taking: Reported on 06/24/2017) 21 tablet 0   No current facility-administered medications for this visit.     Review of Systems Review of Systems Constitutional: negative for fatigue and weight loss Respiratory: negative for cough and wheezing Cardiovascular: negative for chest pain, fatigue and palpitations Gastrointestinal: negative for abdominal pain and change in bowel habits Genitourinary:positive for vaginal irritation Integument/breast: negative for nipple discharge Musculoskeletal:negative for myalgias Neurological: negative for gait problems and  tremors Behavioral/Psych: negative for abusive relationship, depression Endocrine: negative for temperature intolerance      Blood pressure 121/85, pulse 95, height 5\' 1"  (1.549 m), weight 247 lb 11.2 oz (112.4 kg), last menstrual period 06/08/2017, not currently breastfeeding.  Physical Exam Physical Exam           General:  Alert and no distress Abdomen:  normal findings: no organomegaly, soft, non-tender and no hernia  Pelvis:  External genitalia: normal general appearance Urinary system: urethral meatus normal and bladder without fullness, nontender Vaginal: normal without tenderness, induration or masses Cervix: normal appearance Adnexa: normal bimanual exam Uterus: anteverted and non-tender, normal size    50% of 15 min visit spent on counseling and coordination of care.    Data Reviewed Labs  Assessment     1. Vaginal discharge Rx: - Cervicovaginal ancillary only  2. Vaginal irritation   3. Screen for STD (sexually transmitted disease) Rx: - Cervicovaginal ancillary only  Plan    Follow up in 1 year for Annual  No orders of the defined types were placed in this encounter.  No orders of the defined types were placed in this encounter.

## 2017-06-24 NOTE — Progress Notes (Signed)
[facility]  Complains of  Vaginal irritation.

## 2017-06-25 LAB — CERVICOVAGINAL ANCILLARY ONLY
BACTERIAL VAGINITIS: POSITIVE — AB
Candida vaginitis: POSITIVE — AB
Chlamydia: NEGATIVE
NEISSERIA GONORRHEA: NEGATIVE
Trichomonas: NEGATIVE

## 2017-06-26 ENCOUNTER — Other Ambulatory Visit: Payer: Self-pay | Admitting: Obstetrics

## 2017-06-26 DIAGNOSIS — B3731 Acute candidiasis of vulva and vagina: Secondary | ICD-10-CM

## 2017-06-26 DIAGNOSIS — N760 Acute vaginitis: Principal | ICD-10-CM

## 2017-06-26 DIAGNOSIS — B9689 Other specified bacterial agents as the cause of diseases classified elsewhere: Secondary | ICD-10-CM

## 2017-06-26 DIAGNOSIS — B373 Candidiasis of vulva and vagina: Secondary | ICD-10-CM

## 2017-06-26 MED ORDER — FLUCONAZOLE 150 MG PO TABS: 150.0000 mg | ORAL_TABLET | Freq: Once | ORAL | 0 refills | Status: AC

## 2017-06-26 MED ORDER — METRONIDAZOLE 500 MG PO TABS: 500.0000 mg | ORAL_TABLET | Freq: Two times a day (BID) | ORAL | 2 refills | Status: DC

## 2017-06-29 ENCOUNTER — Telehealth: Payer: Self-pay

## 2017-06-29 DIAGNOSIS — N760 Acute vaginitis: Principal | ICD-10-CM

## 2017-06-29 DIAGNOSIS — B9689 Other specified bacterial agents as the cause of diseases classified elsewhere: Secondary | ICD-10-CM

## 2017-06-29 MED ORDER — METRONIDAZOLE 500 MG PO TABS
500.0000 mg | ORAL_TABLET | Freq: Two times a day (BID) | ORAL | 2 refills | Status: DC
Start: 2017-06-29 — End: 2019-07-11

## 2017-06-29 NOTE — Telephone Encounter (Signed)
Pt requested rx be sent to a different pharmacy. Rx re sent

## 2018-02-16 ENCOUNTER — Ambulatory Visit (INDEPENDENT_AMBULATORY_CARE_PROVIDER_SITE_OTHER): Payer: Medicaid Other | Admitting: Obstetrics

## 2018-02-16 ENCOUNTER — Encounter: Payer: Self-pay | Admitting: Obstetrics

## 2018-02-16 ENCOUNTER — Encounter: Payer: Self-pay | Admitting: *Deleted

## 2018-02-16 DIAGNOSIS — Z3046 Encounter for surveillance of implantable subdermal contraceptive: Secondary | ICD-10-CM

## 2018-02-16 NOTE — Progress Notes (Signed)
Patient presents for Nexplanon Removal today.  [facility]ed 12/[facility] at [facility]   Pt states she does not want to continue prescription Contraception at this time.  Pt wants to try natural measures with heavy period.

## 2018-02-17 ENCOUNTER — Encounter: Payer: Self-pay | Admitting: Obstetrics

## 2018-02-17 NOTE — Progress Notes (Signed)
NEXPLANON REMOVAL NOTE  Date of LMP:   unknown  Contraception used: *Nexplanon   Indications:  The patient desires contraception.  She understands risks, benefits, and alternatives to Implanon and would like to proceed.  Anesthesia:   Lidocaine 1% plain.  Procedure:  A time-out was performed confirming the procedure and the patient's allergy status.  Complications: None                      The rod was palpated and the area was sterilely prepped.  The area beneath the distal tip was anesthetized with 1% xylocaine and the skin incised                       Over the tip and the tip was exposed, grasped with forcep and removed intact.  A single suture of 4-0 Vicryl was used to close incision.  Steri strip                       And a bandage applied and the arm was wrapped with gauze bandage.  The patient tolerated well.  Instructions:  The patient was instructed to remove the dressing in 24 hours and that some bruising is to be expected.  She was advised to use over the counter analgesics as needed for any pain at the site.  She is to keep the area dry for 24 hours and to call if her hand or arm becomes cold, numb, or blue.  Return visit:  Return in 2 weeks   Brock BadHARLES A. Earlena Werst MD 02-17-2018

## 2018-02-22 ENCOUNTER — Telehealth: Payer: Self-pay

## 2018-02-22 NOTE — Telephone Encounter (Signed)
TC from pt regarding nexplanon removal site Pt states skin is bruised and irritated Believes she is having an allergic reaction to adhesive noted itching as well.  Pt has changed band-aid everyday and has also tried using guaze and tape skin is raw Pt advised to let site get air and try benadryl and apply antibiotic ointment since wound is not open and there is a stitch.    if sx's worsen to contact the office Pt voiced understanding .

## 2018-02-28 ENCOUNTER — Ambulatory Visit: Payer: Medicaid Other | Admitting: Obstetrics

## 2018-03-02 ENCOUNTER — Ambulatory Visit: Payer: Medicaid Other | Admitting: Obstetrics

## 2018-05-10 ENCOUNTER — Ambulatory Visit: Payer: Medicaid Other | Admitting: Obstetrics

## 2018-05-12 ENCOUNTER — Encounter: Payer: Self-pay | Admitting: Obstetrics

## 2018-05-12 ENCOUNTER — Ambulatory Visit (INDEPENDENT_AMBULATORY_CARE_PROVIDER_SITE_OTHER): Payer: Medicaid Other | Admitting: Obstetrics

## 2018-05-12 ENCOUNTER — Other Ambulatory Visit (HOSPITAL_COMMUNITY)
Admission: RE | Admit: 2018-05-12 | Discharge: 2018-05-12 | Disposition: A | Discharge status: Home / Self Care | Payer: Medicaid Other | Source: Ambulatory Visit | Attending: Obstetrics | Admitting: Obstetrics

## 2018-05-12 VITALS — BP 130/85 | HR 84 | Ht 61.0 in | Wt 278.0 lb

## 2018-05-12 DIAGNOSIS — Z113 Encounter for screening for infections with a predominantly sexual mode of transmission: Secondary | ICD-10-CM

## 2018-05-12 DIAGNOSIS — Z3009 Encounter for other general counseling and advice on contraception: Secondary | ICD-10-CM

## 2018-05-12 DIAGNOSIS — Z124 Encounter for screening for malignant neoplasm of cervix: Secondary | ICD-10-CM

## 2018-05-12 DIAGNOSIS — Z0000 Encounter for general adult medical examination without abnormal findings: Secondary | ICD-10-CM

## 2018-05-12 DIAGNOSIS — Z01419 Encounter for gynecological examination (general) (routine) without abnormal findings: Secondary | ICD-10-CM

## 2018-05-12 DIAGNOSIS — N898 Other specified noninflammatory disorders of vagina: Secondary | ICD-10-CM

## 2018-05-12 NOTE — Progress Notes (Signed)
Patient presents for Annual Exam.  Last pap: 03/29/2017  Contraception:None  STD Testing: Hx of Trich  Pt wants STD screening and wants testing for BV and yeast.   CC:  None per pt

## 2018-05-12 NOTE — Progress Notes (Signed)
Subjective:        [name] [name] is a 22 y.o. female here for a routine exam.  Current complaints: None.    Personal health questionnaire:  Is patient Ashkenazi Jewish, have a family history of breast and/or ovarian cancer: no Is there a family history of uterine cancer diagnosed at age < 26, gastrointestinal cancer, urinary tract cancer, family member who is a Personnel officer syndrome-associated carrier: no Is the patient overweight and hypertensive, family history of diabetes, personal history of gestational diabetes, preeclampsia or PCOS: no Is patient over 64, have PCOS,  family history of premature CHD under age 45, diabetes, smoke, have hypertension or peripheral artery disease:  no At any time, has a partner hit, kicked or otherwise hurt or frightened you?: no Over the past 2 weeks, have you felt down, depressed or hopeless?: no Over the past 2 weeks, have you felt little interest or pleasure in doing things?:no   Gynecologic History Patient's last menstrual period was 05/06/2018. Contraception: none Last Pap: 2018. Results were: normal Last mammogram: n/a. Results were: n/a  Obstetric History OB History  Gravida Para Term Preterm AB Living  1 1 1     1   SAB TAB Ectopic Multiple Live Births        0 1    # Outcome Date GA Lbr Len/2nd Weight Sex Delivery Anes PTL Lv  1 Term 09/18/15 [redacted]w[redacted]d  7 lb 10.1 oz (3.46 kg) M CS-LTranv EPI  LIV     Birth Comments: wnl and c/w GA    Past Medical History:  Diagnosis Date  . Gall bladder inflammation   . GERD (gastroesophageal reflux disease)   . Medical history non-contributory     Past Surgical History:  Procedure Laterality Date  . CESAREAN SECTION N/A 09/18/2015   Procedure: CESAREAN SECTION;  Surgeon: Richarda Overlie, MD;  Location: [facility];  Service: Obstetrics;  Laterality: N/A;  . MOUTH SURGERY    . NO PAST SURGERIES       Current Outpatient Medications:  .  ibuprofen (ADVIL,MOTRIN) 800 MG tablet, Take 1 tablet (800 mg  total) by mouth 3 (three) times daily. (Patient not taking: Reported on 06/24/2017), Disp: 21 tablet, Rfl: 0 .  metroNIDAZOLE (FLAGYL) 500 MG tablet, Take 1 tablet (500 mg total) by mouth 2 (two) times daily. (Patient not taking: Reported on 02/16/2018), Disp: 14 tablet, Rfl: 2 Allergies  Allergen Reactions  . Bee Venom   . Shellfish Allergy Hives    Social History   Tobacco Use  . Smoking status: Light Tobacco Smoker    Types: Cigarettes    Last attempt to quit: 01/14/2015    Years since quitting: 3.3  . Smokeless tobacco: Never Used  Substance Use Topics  . Alcohol use: Yes    Family History  Problem Relation Age of Onset  . Diabetes Maternal Uncle   . Hypertension Maternal Uncle   . Heart disease Maternal Uncle       Review of Systems  Constitutional: negative for fatigue and weight loss Respiratory: negative for cough and wheezing Cardiovascular: negative for chest pain, fatigue and palpitations Gastrointestinal: negative for abdominal pain and change in bowel habits Musculoskeletal:negative for myalgias Neurological: negative for gait problems and tremors Behavioral/Psych: negative for abusive relationship, depression Endocrine: negative for temperature intolerance    Genitourinary:negative for abnormal menstrual periods, genital lesions, hot flashes, sexual problems and vaginal discharge Integument/breast: negative for breast lump, breast tenderness, nipple discharge and skin lesion(s)    Objective:  BP 130/85   Pulse 84   Ht 5\' 1"  (1.549 m)   Wt 278 lb (126.1 kg)   LMP 05/06/2018   BMI 52.53 kg/m  General:   alert  Skin:   no rash or abnormalities  Lungs:   clear to auscultation bilaterally  Heart:   regular rate and rhythm, S1, S2 normal, no murmur, click, rub or gallop  Breasts:   normal without suspicious masses, skin or nipple changes or axillary nodes  Abdomen:  normal findings: no organomegaly, soft, non-tender and no hernia  Pelvis:  External  genitalia: normal general appearance Urinary system: urethral meatus normal and bladder without fullness, nontender Vaginal: normal without tenderness, induration or masses Cervix: normal appearance Adnexa: normal bimanual exam Uterus: anteverted and non-tender, normal size   Lab Review Urine pregnancy test Labs reviewed yes Radiologic studies reviewed no  50% of 20 min visit spent on counseling and coordination of care.   Assessment:     1. Encounter for gynecological examination without abnormal finding  2. Screening for cervical cancer Rx: - Cytology - PAP  3. Vaginal discharge Rx: - Cervicovaginal ancillary only  4. Screening for STD (sexually transmitted disease) Rx: - RPR - HIV antibody - Hepatitis C antibody - Hepatitis B surface antigen  5. Encounter for other general counseling and advice on contraception    Plan:    Education reviewed: calcium supplements, depression evaluation, low fat, low cholesterol diet, safe sex/STD prevention, self breast exams and weight bearing exercise. Follow up in: 1 year.   No orders of the defined types were placed in this encounter.  Orders Placed This Encounter  Procedures  . RPR  . HIV antibody  . Hepatitis C antibody  . Hepatitis B surface antigen    Brock BadHARLES A. Solana Coggin MD 05-12-2018

## 2018-05-13 LAB — CERVICOVAGINAL ANCILLARY ONLY
BACTERIAL VAGINITIS: POSITIVE — AB
Candida vaginitis: NEGATIVE
Chlamydia: NEGATIVE
NEISSERIA GONORRHEA: NEGATIVE
TRICH (WINDOWPATH): NEGATIVE

## 2018-05-13 LAB — RPR: RPR Ser Ql: NONREACTIVE

## 2018-05-13 LAB — HIV ANTIBODY (ROUTINE TESTING W REFLEX): HIV SCREEN 4TH GENERATION: NONREACTIVE

## 2018-05-13 LAB — HEPATITIS B SURFACE ANTIGEN: HEP B S AG: NEGATIVE

## 2018-05-13 LAB — HEPATITIS C ANTIBODY

## 2018-05-14 ENCOUNTER — Other Ambulatory Visit: Payer: Self-pay | Admitting: Obstetrics

## 2018-05-14 DIAGNOSIS — B9689 Other specified bacterial agents as the cause of diseases classified elsewhere: Secondary | ICD-10-CM

## 2018-05-14 DIAGNOSIS — N760 Acute vaginitis: Principal | ICD-10-CM

## 2018-05-14 MED ORDER — TINIDAZOLE 500 MG PO TABS: 1000.0000 mg | ORAL_TABLET | Freq: Every day | ORAL | 2 refills | Status: DC

## 2018-05-16 LAB — CYTOLOGY - PAP: Diagnosis: NEGATIVE

## 2019-07-11 ENCOUNTER — Other Ambulatory Visit (HOSPITAL_COMMUNITY)
Admission: RE | Admit: 2019-07-11 | Discharge: 2019-07-11 | Disposition: A | Discharge status: Home / Self Care | Payer: Medicaid Other | Source: Ambulatory Visit | Attending: Obstetrics and Gynecology | Admitting: Obstetrics and Gynecology

## 2019-07-11 ENCOUNTER — Other Ambulatory Visit: Payer: Self-pay

## 2019-07-11 ENCOUNTER — Encounter: Payer: Self-pay | Admitting: Obstetrics and Gynecology

## 2019-07-11 ENCOUNTER — Ambulatory Visit (INDEPENDENT_AMBULATORY_CARE_PROVIDER_SITE_OTHER): Payer: Medicaid Other | Admitting: Obstetrics and Gynecology

## 2019-07-11 ENCOUNTER — Other Ambulatory Visit: Payer: Self-pay | Admitting: Obstetrics and Gynecology

## 2019-07-11 VITALS — BP 118/84 | HR 71 | Ht 60.5 in | Wt 262.0 lb

## 2019-07-11 DIAGNOSIS — Z0000 Encounter for general adult medical examination without abnormal findings: Secondary | ICD-10-CM | POA: Diagnosis not present

## 2019-07-11 DIAGNOSIS — R634 Abnormal weight loss: Secondary | ICD-10-CM

## 2019-07-11 DIAGNOSIS — Z113 Encounter for screening for infections with a predominantly sexual mode of transmission: Secondary | ICD-10-CM | POA: Diagnosis not present

## 2019-07-11 DIAGNOSIS — Z01419 Encounter for gynecological examination (general) (routine) without abnormal findings: Secondary | ICD-10-CM | POA: Diagnosis not present

## 2019-07-11 NOTE — Patient Instructions (Signed)
HPV Vaccine Information for Parents    HPV (human papillomavirus) is a common virus that spreads from person to person through sexual contact. It can spread during vaginal, anal, or oral sex. There are many types of HPV viruses, and some may cause cancer.  Your child can get a vaccination to prevent HPV infection and cancer. The vaccine is both safe and effective. It is recommended for boys and girls at about 11-25 years of age. Getting the vaccination at this age--before becoming sexually active--gives your child the best chance at protection from HPV infection through adulthood.  How can HPV affect my child?  HPV infection can cause:  · Genital warts.  · Mouth or throat cancer (oropharyngeal cancer).  · Anal cancer.  · Cervical, vulvar, or vaginal cancer.  · Penile cancer.  During pregnancy, HPV infection can be passed to the baby. This infection can cause warts to develop in a baby's throat and windpipe.  What actions can I take to lower my child's risk for HPV?  To lower your child's risk for HPV infection, have him or her get the HPV vaccination before becoming sexually active. The best time for vaccination is between ages 11 and 12, though it can be given to children as young as 9 years old. If your child gets the first dose before age 15, the vaccination can be given as 2 shots (doses), 6-12 months apart. In some situations, 3 doses are needed:  · If your child starts the vaccine before age 15 but does not have a second dose within 6-12 months, your child will need 3 doses to complete the vaccination. When your child has the first dose, it is important to make an appointment for the next shot and keep the appointment.  · Teens who are not vaccinated before age 15 will need 3 doses given within 6 months.  · If your child has a weak immune system, he or she may need 3 doses.  Young adults can also get the vaccination, even if they are already sexually active and even if they have already been infected with HPV.  The vaccination can still help prevent the types of cancer-causing HPV that a person has not been infected with.  What are the risks and benefits of the HPV vaccine?  Benefits  The main benefit of getting vaccinated is to prevent certain cancers, including:  · Cervical, vulvar, and vaginal cancer in females.  · Penile cancer in males.  · Oral and anal cancer in both males and females.  The risk of these cancers is lower if your child gets vaccinated before he or she becomes sexually active.  The vaccine also prevents genital warts caused by HPV.  Risks  The risks, although low, include side effects or reactions to the vaccine. Very few reactions have been reported, but they can include:  · Soreness, redness, or swelling at the injection site.  · Dizziness or headache.  · Fever.  Who should not get the HPV vaccine or should wait to get it?  Some children should not get the HPV vaccine or should wait. Discuss the risks and benefits of the vaccine with your child's health care provider if your child:  · Has had a severe allergic reaction to other vaccinations.  · Is allergic to yeast.  · Has a fever.  · Has had a recent illness.  · Is pregnant or may be pregnant.  Where to find more information  · Centers for Disease Control and   get a vaccination to prevent HPV infection and cancer. It is best to get the vaccination before becoming sexually active.  The HPV vaccine can protect your child from genital warts and certain types of cancer, including cancer of the cervix, throat, mouth, vulva, vagina, anus, and penis.  The HPV vaccine is both safe and effective.  The best time for boys and girls to get the vaccination is when they are between ages 64 and 50.  This information is not intended to replace advice given to you by your health care provider. Make sure you discuss any questions you have with your health care provider. Document Released: 12/30/2017 Document Revised: 02/28/2018 Document Reviewed: 12/30/2017 Elsevier Patient Education  2020 [employer].

## 2019-07-11 NOTE — Progress Notes (Signed)
Pt has MCD-FP and would like STD testing that is available. Pt has no other concerns today.

## 2019-07-11 NOTE — Progress Notes (Signed)
GYNECOLOGY ANNUAL PREVENTATIVE CARE ENCOUNTER NOTE  Subjective:   [name] [name] is a 23 y.o. [id number] female here for a annual gynecologic exam. Current complaints: none, desires STI screen, no known exposure.  Denies abnormal vaginal bleeding, discharge, pelvic pain, problems with intercourse or other gynecologic concerns. Periods are "normal", they are lighter than they used to be.   Uses condoms, declines other options.    Gynecologic History Patient's last menstrual period was 06/18/2019. Contraception: condoms Last Pap: 04/2018. Results were: negative Last mammogram: na/ Gardisil: has not received  Obstetric History OB History  Gravida Para Term Preterm AB Living  1 1 1     1   SAB TAB Ectopic Multiple Live Births        0 1    # Outcome Date GA Lbr Len/2nd Weight Sex Delivery Anes PTL Lv  1 Term 09/18/15 [id number]  7 lb 10.1 oz (3.46 kg) M CS-LTranv EPI  LIV     Birth Comments: wnl and c/w GA    Past Medical History:  Diagnosis Date  . Gall bladder inflammation   . GERD (gastroesophageal reflux disease)   . Medical history non-contributory     Past Surgical History:  Procedure Laterality Date  . CESAREAN SECTION N/A 09/18/2015   Procedure: CESAREAN SECTION;  Surgeon: Richarda Overlieichard [name], MD;  Location: [facility];  Service: Obstetrics;  Laterality: N/A;  . MOUTH SURGERY    . NO PAST SURGERIES      Current Outpatient Medications on File Prior to Visit  Medication Sig Dispense Refill  . ibuprofen (ADVIL,MOTRIN) 800 MG tablet Take 1 tablet (800 mg total) by mouth 3 (three) times daily. (Patient not taking: Reported on 06/24/2017) 21 tablet 0   No current facility-administered medications on file prior to visit.     Allergies  Allergen Reactions  . Bee Venom   . Shellfish Allergy Hives    Social History   Socioeconomic History  . Marital status: Single    Spouse name: Not on file  . Number of children: Not on file  . Years of education: Not on file  . Highest  education level: Not on file  Occupational History  . Not on file  Social Needs  . Financial resource strain: Not on file  . Food insecurity    Worry: Not on file    Inability: Not on file  . Transportation needs    Medical: Not on file    Non-medical: Not on file  Tobacco Use  . Smoking status: Light Tobacco Smoker    Types: Cigarettes, Cigars    Last attempt to quit: 01/14/2015    Years since quitting: 4.4  . Smokeless tobacco: Never Used  . Tobacco comment: 1/day  Substance and Sexual Activity  . Alcohol use: Yes    Comment: occ  . Drug use: No  . Sexual activity: Yes    Partners: Male    Birth control/protection: None  Lifestyle  . Physical activity    Days per week: Not on file    Minutes per session: Not on file  . Stress: Not on file  Relationships  . Social Musicianconnections    Talks on phone: Not on file    Gets together: Not on file    Attends religious service: Not on file    Active member of club or organization: Not on file    Attends meetings of clubs or organizations: Not on file    Relationship status: Not on file  .  Intimate partner violence    Fear of current or ex partner: Not on file    Emotionally abused: Not on file    Physically abused: Not on file    Forced sexual activity: Not on file  Other Topics Concern  . Not on file  Social History Narrative  . Not on file    Family History  Problem Relation Age of Onset  . Diabetes Maternal Uncle   . Hypertension Maternal Uncle   . Heart disease Maternal Uncle    Diet: "horrible", reports she does not eat much, reports she has lost 30 pounds in last 3 months unintentionally Exercise: does not exercise regularly  The following portions of the patient's history were reviewed and updated as appropriate: allergies, current medications, past family history, past medical history, past social history, past surgical history and problem list.  Review of Systems Pertinent items are noted in HPI.    Objective:  BP 118/84   Pulse 71   Ht 5' 0.5" (1.537 m)   Wt 262 lb (118.8 kg)   LMP 06/18/2019   BMI 50.33 kg/m  CONSTITUTIONAL: Well-developed, well-nourished female in no acute distress.  HENT:  Normocephalic, atraumatic, External right and left ear normal. Oropharynx is clear and moist EYES: Conjunctivae and EOM are normal. Pupils are equal, round, and reactive to light. No scleral icterus.  NECK: Normal range of motion, supple, no masses.  Normal thyroid.  SKIN: Skin is warm and dry. No rash noted. Not diaphoretic. No erythema. No pallor. NEUROLOGIC: Alert and oriented to person, place, and time. Normal reflexes, muscle tone coordination. No cranial nerve deficit noted. PSYCHIATRIC: Normal mood and affect. Normal behavior. Normal judgment and thought content. CARDIOVASCULAR: Normal heart rate noted, regular rhythm RESPIRATORY: Clear to auscultation bilaterally. Effort and breath sounds normal, no problems with respiration noted. BREASTS: Symmetric in size. No masses, skin changes, nipple drainage, or lymphadenopathy. ABDOMEN: Soft, normal bowel sounds, no distention noted.  No tenderness, rebound or guarding.  PELVIC: Normal appearing external genitalia; normal appearing vaginal mucosa and cervix.  No abnormal discharge noted.  Pelvic cultures obtained.  Normal uterine size, no other palpable masses, no uterine or adnexal tenderness. MUSCULOSKELETAL: Normal range of motion. No tenderness.  No cyanosis, clubbing, or edema.  2+ distal pulses.  Exam done with chaperone present.  Assessment and Plan:   1. Routine screening for STI (sexually transmitted infection) - Cervicovaginal ancillary only( [place]) - HIV Antibody (routine testing w rflx) - RPR  2. Well woman exam No issues Declines contraception Gave info for gardisil Next pap due 04/2021  3. Obesity, morbid, BMI 50 or higher ([place]) - Referral to Nutrition and Diabetes Services  4. Weight loss, unintentional - TSH  - CBC    Will follow up results of STI screen and manage accordingly. Encouraged improvement in diet and exercise.  Flu vaccine declined Gardisil declined today, gave info  Routine preventative health maintenance measures emphasized. Please refer to After Visit Summary for other counseling recommendations.   Total face-to-face time with patient: 30 minutes. Over 50% of encounter was spent on counseling and coordination of care.   Feliz Beam, M.D. Attending Center for [employer] farm manager)

## 2019-07-12 LAB — CBC
Hematocrit: 35.3 % (ref 34.0–46.6)
Hemoglobin: 11.4 g/dL (ref 11.1–15.9)
MCH: 27.2 pg (ref 26.6–33.0)
MCHC: 32.3 g/dL (ref 31.5–35.7)
MCV: 84 fL (ref 79–97)
Platelets: 256 10*3/uL (ref 150–450)
RBC: 4.19 x10E6/uL (ref 3.77–5.28)
RDW: 15.8 % — ABNORMAL HIGH (ref 11.7–15.4)
WBC: 7.1 10*3/uL (ref 3.4–10.8)

## 2019-07-12 LAB — HIV ANTIBODY (ROUTINE TESTING W REFLEX): HIV Screen 4th Generation wRfx: NONREACTIVE

## 2019-07-12 LAB — TSH: TSH: 1.66 u[IU]/mL (ref 0.450–4.500)

## 2019-07-12 LAB — RPR: RPR Ser Ql: NONREACTIVE

## 2019-07-13 LAB — CERVICOVAGINAL ANCILLARY ONLY
Chlamydia: NEGATIVE
Neisseria Gonorrhea: NEGATIVE

## 2019-10-12 ENCOUNTER — Other Ambulatory Visit: Payer: Self-pay

## 2019-10-12 DIAGNOSIS — Z20828 Contact with and (suspected) exposure to other viral communicable diseases: Secondary | ICD-10-CM | POA: Diagnosis not present

## 2019-10-12 DIAGNOSIS — Z20822 Contact with and (suspected) exposure to covid-19: Secondary | ICD-10-CM

## 2019-10-13 LAB — NOVEL CORONAVIRUS, NAA: SARS-CoV-2, NAA: DETECTED — AB

## 2019-10-14 ENCOUNTER — Telehealth (HOSPITAL_COMMUNITY): Payer: Self-pay | Admitting: Critical Care Medicine

## 2019-10-14 NOTE — Telephone Encounter (Signed)
I connected with this patient who is Covid positive from December 17 testing.  She became symptomatic December 14 with headaches and nasal congestion.  She is now asymptomatic and knows she needs to stay in isolation through December 24 she is not a candidate for the monoclonal antibody

## 2019-10-14 NOTE — Telephone Encounter (Signed)
-----   Message from Carlisle Beers, RN sent at 10/14/2019  7:00 AM EST -----  ----- Message ----- From: Interface, [facility] Lab Results In Sent: 10/13/2019   7:35 PM EST To: Mobile Screening Testing Result Pool

## 2019-10-26 ENCOUNTER — Telehealth: Payer: Medicaid Other | Admitting: Physician Assistant

## 2019-10-26 DIAGNOSIS — U071 COVID-19: Secondary | ICD-10-CM

## 2019-10-26 DIAGNOSIS — R05 Cough: Secondary | ICD-10-CM

## 2019-10-26 DIAGNOSIS — R059 Cough, unspecified: Secondary | ICD-10-CM

## 2019-10-26 MED ORDER — BENZONATATE 100 MG PO CAPS: 100.0000 mg | ORAL_CAPSULE | Freq: Three times a day (TID) | ORAL | 0 refills | Status: DC | PRN

## 2019-10-26 NOTE — Progress Notes (Signed)
Your test for COVID-19 was positive on 10/12/2019 and you were placed into quarantine until 10/19/2019.  We are seeing some intermittent recurrence of symptoms after an initial infection.  I suspect this is the cause of your cough.  I will prescribe a cough medication for you as noted below.  You do not need to be tested again.  Please keep an eye on your symptoms and present to the emergency department or urgent care if you develop shortness of breath, persistent high fevers, persistent vomiting or other concerns.  Please continue good preventive care measures, including:  frequent hand-washing, avoid touching your face, cover coughs/sneezes, stay out of crowds and keep a 6 foot distance from others.  Recheck or go to the nearest hospital ED tent for re-assessment if fever/cough/breathlessness return. he following symptoms may appear 2-14 days after exposure: . Fever . Cough . Shortness of breath or difficulty breathing . Chills . Repeated shaking with chills . Muscle pain . Headache . Sore throat . New loss of taste or smell . Fatigue . Congestion or runny nose . Nausea or vomiting . Diarrhea  Go to the nearest hospital ED for assessment if fever/cough/breathlessness are severe or illness seems like a threat to life.  It is vitally important that if you feel that you have an infection such as this virus or any other virus that you stay home and away from places where you may spread it to others.  You should avoid contact with people age 33 and older.   You can use medication such as A prescription cough medication called Tessalon Perles 100 mg. You may take 1-2 capsules every 8 hours as needed for cough  You may also take acetaminophen (Tylenol) as needed for fever.  Reduce your risk of any infection by using the same precautions used for avoiding the common cold or flu:  Marland Kitchen Wash your hands often with soap and warm water for at least 20 seconds.  If soap and water are not readily  available, use an alcohol-based hand sanitizer with at least 60% alcohol.  . If coughing or sneezing, cover your mouth and nose by coughing or sneezing into the elbow areas of your shirt or coat, into a tissue or into your sleeve (not your hands). . Avoid shaking hands with others and consider head nods or verbal greetings only. . Avoid touching your eyes, nose, or mouth with unwashed hands.  . Avoid close contact with people who are sick. . Avoid places or events with large numbers of people in one location, like concerts or sporting events. . Carefully consider travel plans you have or are making. . If you are planning any travel outside or inside the [place], visit the CDC's Travelers' Health webpage for the latest health notices. . If you have some symptoms but not all symptoms, continue to monitor at home and seek medical attention if your symptoms worsen. . If you are having a medical emergency, call 911.  HOME CARE . Only take medications as instructed by your medical team. . Drink plenty of fluids and get plenty of rest. . A steam or ultrasonic humidifier can help if you have congestion.   GET HELP RIGHT AWAY IF YOU HAVE EMERGENCY WARNING SIGNS** FOR COVID-19. If you or someone is showing any of these signs seek emergency medical care immediately. Call 911 or proceed to your closest emergency facility if: . You develop worsening high fever. . Trouble breathing . Bluish lips or face . Persistent pain or  pressure in the chest . New confusion . Inability to wake or stay awake . You cough up blood. . Your symptoms become more severe  **This list is not all possible symptoms. Contact your medical provider for any symptoms that are sever or concerning to you.  MAKE SURE YOU   Understand these instructions.  Will watch your condition.  Will get help right away if you are not doing well or get worse.  Your e-visit answers were reviewed by a board certified advanced clinical  practitioner to complete your personal care plan.  Depending on the condition, your plan could have included both over the counter or prescription medications.  If there is a problem please reply once you have received a response from your provider.  Your safety is important to [place].  If you have drug allergies check your prescription carefully.    You can use MyChart to ask questions about today's visit, request a non-urgent call back, or ask for a work or school excuse for 24 hours related to this e-Visit. If it has been greater than 24 hours you will need to follow up with your provider, or enter a new e-Visit to address those concerns. You will get an e-mail in the next two days asking about your experience.  I hope that your e-visit has been valuable and will speed your recovery. Thank you for using e-visits.    Greater than 5 minutes, yet less than 10 minutes of time have been spent researching, coordinating, and implementing care for this patient today

## 2020-03-02 ENCOUNTER — Ambulatory Visit (HOSPITAL_COMMUNITY)
Admission: EM | Admit: 2020-03-02 | Discharge: 2020-03-02 | Disposition: A | Discharge status: Home / Self Care | Payer: Medicaid Other | Attending: Family Medicine | Admitting: Family Medicine

## 2020-03-02 ENCOUNTER — Other Ambulatory Visit: Payer: Self-pay

## 2020-03-02 ENCOUNTER — Encounter (HOSPITAL_COMMUNITY): Payer: Self-pay

## 2020-03-02 DIAGNOSIS — J028 Acute pharyngitis due to other specified organisms: Secondary | ICD-10-CM | POA: Diagnosis not present

## 2020-03-02 DIAGNOSIS — B9789 Other viral agents as the cause of diseases classified elsewhere: Secondary | ICD-10-CM | POA: Insufficient documentation

## 2020-03-02 DIAGNOSIS — F17210 Nicotine dependence, cigarettes, uncomplicated: Secondary | ICD-10-CM | POA: Insufficient documentation

## 2020-03-02 DIAGNOSIS — J3089 Other allergic rhinitis: Secondary | ICD-10-CM | POA: Insufficient documentation

## 2020-03-02 DIAGNOSIS — J029 Acute pharyngitis, unspecified: Secondary | ICD-10-CM

## 2020-03-02 DIAGNOSIS — Z20822 Contact with and (suspected) exposure to covid-19: Secondary | ICD-10-CM | POA: Diagnosis not present

## 2020-03-02 LAB — POCT RAPID STREP A: Streptococcus, Group A Screen (Direct): NEGATIVE

## 2020-03-02 LAB — SARS CORONAVIRUS 2 (TAT 6-24 HRS): SARS Coronavirus 2: NEGATIVE

## 2020-03-02 MED ORDER — IBUPROFEN 600 MG PO TABS
600.0000 mg | ORAL_TABLET | Freq: Four times a day (QID) | ORAL | 0 refills | Status: DC | PRN
Start: 2020-03-02 — End: 2020-03-02

## 2020-03-02 MED ORDER — CETIRIZINE HCL 10 MG PO TABS
10.0000 mg | ORAL_TABLET | Freq: Every day | ORAL | 0 refills | Status: DC
Start: 2020-03-02 — End: 2021-06-13

## 2020-03-02 MED ORDER — FLUTICASONE PROPIONATE 50 MCG/ACT NA SUSP
1.0000 | Freq: Every day | NASAL | 2 refills | Status: DC
Start: 2020-03-02 — End: 2021-06-13

## 2020-03-02 MED ORDER — CEPACOL SORE THROAT 5.4 MG MT LOZG: 1.0000 | LOZENGE | OROMUCOSAL | 0 refills | Status: DC | PRN

## 2020-03-02 MED ORDER — IBUPROFEN 600 MG PO TABS: 600.0000 mg | ORAL_TABLET | Freq: Four times a day (QID) | ORAL | 0 refills | Status: DC | PRN

## 2020-03-02 NOTE — ED Provider Notes (Signed)
[place]    CSN: [id number] Arrival date & time: 03/02/20  1013      History   Chief Complaint Chief Complaint  Patient presents with  . Sore Throat    HPI [name] [name] is a 24 y.o. female.   Patient reports for evaluation of sore throat, ear popping and nasal congestion.  She reports sore throat started about 4 days ago.  She reports it has been fairly painful and has made it difficult to swallow foods.  She is also reporting nasal congestion with sneezing and runny nose in the same timeframe.  She also reports feeling like ear fullness and ear popping her last few days.  She has had a mild cough to clear her throat denies shortness of breath or chest pain.  Denies significant headache.  Been no nausea, vomiting or diarrhea.  Denies fever and chills..  Imaging over-the-counter medicines without much relief.   Patient had COVID-19 infection in 10/15/2019.     Past Medical History:  Diagnosis Date  . Gall bladder inflammation   . GERD (gastroesophageal reflux disease)   . Medical history non-contributory     Patient Active Problem List   Diagnosis Date Noted  . Pregnancy 09/18/2015    Past Surgical History:  Procedure Laterality Date  . CESAREAN SECTION N/A 09/18/2015   Procedure: CESAREAN SECTION;  Surgeon: Molli Posey, MD;  Location: [facility];  Service: Obstetrics;  Laterality: N/A;  . MOUTH SURGERY    . NO PAST SURGERIES      OB History    Gravida  1   Para  1   Term  1   Preterm      AB      Living  1     SAB      TAB      Ectopic      Multiple  0   Live Births  1            Home Medications    Prior to Admission medications   Medication Sig Start Date End Date Taking? Authorizing Provider  benzonatate (TESSALON PERLES) 100 MG capsule Take 1 capsule (100 mg total) by mouth 3 (three) times daily as needed for cough (cough). 10/26/19   Muthersbaugh, Jarrett Soho, PA-C  cetirizine (ZYRTEC ALLERGY) 10 MG tablet Take 1  tablet (10 mg total) by mouth daily. 03/02/20   Annessa Satre, Marguerita Beards, PA-C  fluticasone (FLONASE) 50 MCG/ACT nasal spray Place 1 spray into both nostrils daily. 03/02/20   Hannan Hutmacher, Marguerita Beards, PA-C  ibuprofen (ADVIL) 600 MG tablet Take 1 tablet (600 mg total) by mouth every 6 (six) hours as needed for fever, headache or moderate pain. 03/02/20   Ericka Marcellus, Marguerita Beards, PA-C  Menthol (CEPACOL SORE THROAT) 5.4 MG LOZG Use as directed 1 lozenge (5.4 mg total) in the mouth or throat every 2 (two) hours as needed. 03/02/20   Sandie Swayze, Marguerita Beards, PA-C    Family History Family History  Problem Relation Age of Onset  . Diabetes Maternal Uncle   . Hypertension Maternal Uncle   . Heart disease Maternal Uncle     Social History Social History   Tobacco Use  . Smoking status: Light Tobacco Smoker    Types: Cigarettes, Cigars    Last attempt to quit: 01/14/2015    Years since quitting: 5.1  . Smokeless tobacco: Never Used  . Tobacco comment: 1/day  Substance Use Topics  . Alcohol use: Yes    Comment: occ  .  Drug use: No     Allergies   Bee venom and Shellfish allergy   Review of Systems Review of Systems  Per HPI Physical Exam Triage Vital Signs ED Triage Vitals [03/02/20 1053]  Enc Vitals Group     BP 137/78     Pulse Rate 80     Resp 18     Temp 98.8 F (37.1 C)     Temp Source Oral     SpO2 98 %     Weight 243 lb (110.2 kg)     Height      Head Circumference      Peak Flow      Pain Score 6     Pain Loc      Pain Edu?      Excl. in [place]?    No data found.  Updated Vital Signs BP 137/78 (BP Location: Right Arm)   Pulse 80   Temp 98.8 F (37.1 C) (Oral)   Resp 18   Wt 243 lb (110.2 kg)   LMP 02/28/2020   SpO2 98%   BMI 46.68 kg/m   Visual Acuity Right Eye Distance:   Left Eye Distance:   Bilateral Distance:    Right Eye Near:   Left Eye Near:    Bilateral Near:     Physical Exam Vitals and nursing note reviewed.  Constitutional:      General: She is not in acute distress.     Appearance: She is well-developed. She is not ill-appearing.  HENT:     Head: Normocephalic and atraumatic.     Right Ear: Ear canal normal. No tenderness. A middle ear effusion is present. Tympanic membrane is not erythematous.     Left Ear: Ear canal normal. No tenderness. A middle ear effusion is present. Tympanic membrane is not erythematous.     Nose: Congestion and rhinorrhea present.     Mouth/Throat:     Mouth: Mucous membranes are moist.     Pharynx: Uvula midline. No pharyngeal swelling, oropharyngeal exudate or posterior oropharyngeal erythema.     Tonsils: No tonsillar exudate or tonsillar abscesses. 1+ on the right. 1+ on the left.     Comments: Appears to be a tonsil stone in the left tonsil Eyes:     Conjunctiva/sclera: Conjunctivae normal.  Cardiovascular:     Rate and Rhythm: Normal rate and regular rhythm.     Heart sounds: No murmur.  Pulmonary:     Effort: Pulmonary effort is normal. No respiratory distress.     Breath sounds: Normal breath sounds.  Abdominal:     Palpations: Abdomen is soft.     Tenderness: There is no abdominal tenderness.  Musculoskeletal:     Cervical back: Neck supple.  Lymphadenopathy:     Cervical: No cervical adenopathy.  Skin:    General: Skin is warm and dry.  Neurological:     Mental Status: She is alert.      UC Treatments / Results  Labs (all labs ordered are listed, but only abnormal results are displayed) Labs Reviewed  SARS CORONAVIRUS 2 (TAT 6-24 HRS)  CULTURE, GROUP A STREP [facility])  POCT RAPID STREP A    EKG   Radiology No results found.  Procedures Procedures (including critical care time)  Medications Ordered in UC Medications - No data to display  Initial Impression / Assessment and Plan / UC Course  I have reviewed the triage vital signs and the nursing notes.  Pertinent labs & imaging results  that were available during my care of the patient were reviewed by me and considered in my medical decision  making (see chart for details).     #Viral pharyngitis #Allergic rhinitis Patient is a 24 year old presenting with symptoms consistent with a viral pharyngitis and allergic rhinitis.  Rapid strep was negative in clinic.  Low suspicion for strep given negative rapid and lack of [facility] criteria met.  We will treat symptomatically and sent throat culture.  Ibuprofen, Tylenol and lozenges for sore throat.  Zyrtec and Flonase for congestion and allergy symptoms.  Covid PCR was sent as it has been greater than 90 days since last positive test.  Follow-up and return precautions were discussed with patient and she verbalized understands. Final Clinical Impressions(s) / UC Diagnoses   Final diagnoses:  Viral pharyngitis  Allergic rhinitis due to other allergic trigger, unspecified seasonality     Discharge Instructions     This appears to be viral in nature.  - take the ibuprofen and tylenol for sore throat - use the lozenges for sore throat - start the zyrtec and flonase for congestion and allergy today  We have sent a throat culture and will notify you of any needed changes to your treatment  If your Covid-19 test is positive, you will receive a phone call from [facility] regarding your results. Negative test results are not called. Both positive and negative results area always visible on MyChart. If you do not have a MyChart account, sign up instructions are in your discharge papers.   Persons who are directed to care for themselves at home may discontinue isolation under the following conditions:  . At least 10 days have passed since symptom onset and . At least 24 hours have passed without running a fever (this means without the use of fever-reducing medications) and . Other symptoms have improved.  Persons infected with COVID-19 who never develop symptoms may discontinue isolation and other precautions 10 days after the date of their first positive COVID-19 test.     ED  Prescriptions    Medication Sig Dispense Auth. Provider   Menthol (CEPACOL SORE THROAT) 5.4 MG LOZG Use as directed 1 lozenge (5.4 mg total) in the mouth or throat every 2 (two) hours as needed. 30 lozenge Seanna Sisler, Marguerita Beards, PA-C   ibuprofen (ADVIL) 600 MG tablet  (Status: Discontinued) Take 1 tablet (600 mg total) by mouth every 6 (six) hours as needed for fever, headache or moderate pain. 30 tablet Zebulin Siegel, Marguerita Beards, PA-C   cetirizine (ZYRTEC ALLERGY) 10 MG tablet Take 1 tablet (10 mg total) by mouth daily. 30 tablet Nelsie Domino, Marguerita Beards, PA-C   fluticasone (FLONASE) 50 MCG/ACT nasal spray Place 1 spray into both nostrils daily. 18.2 mL Aracelly Tencza, Marguerita Beards, PA-C   ibuprofen (ADVIL) 600 MG tablet Take 1 tablet (600 mg total) by mouth every 6 (six) hours as needed for fever, headache or moderate pain. 30 tablet Marcanthony Sleight, Marguerita Beards, PA-C     PDMP not reviewed this encounter.   Purnell Shoemaker, PA-C 03/02/20 2120

## 2020-03-02 NOTE — Discharge Instructions (Addendum)
This appears to be viral in nature.  - take the ibuprofen and tylenol for sore throat - use the lozenges for sore throat - start the zyrtec and flonase for congestion and allergy today  We have sent a throat culture and will notify you of any needed changes to your treatment  If your Covid-19 test is positive, you will receive a phone call from [name] regarding your results. Negative test results are not called. Both positive and negative results area always visible on MyChart. If you do not have a MyChart account, sign up instructions are in your discharge papers.   Persons who are directed to care for themselves at home may discontinue isolation under the following conditions:   At least 10 days have passed since symptom onset and  At least 24 hours have passed without running a fever (this means without the use of fever-reducing medications) and  Other symptoms have improved.  Persons infected with COVID-19 who never develop symptoms may discontinue isolation and other precautions 10 days after the date of their first positive COVID-19 test.

## 2020-03-02 NOTE — ED Triage Notes (Signed)
Pt states her throat hurts and her ears have been popping x 4 days.

## 2020-03-04 LAB — CULTURE, GROUP A STREP (THRC)

## 2020-06-06 ENCOUNTER — Other Ambulatory Visit: Payer: Self-pay

## 2020-06-06 ENCOUNTER — Encounter (HOSPITAL_COMMUNITY): Payer: Self-pay

## 2020-06-06 ENCOUNTER — Emergency Department (HOSPITAL_COMMUNITY)
Admission: EM | Admit: 2020-06-06 | Discharge: 2020-06-06 | Disposition: A | Discharge status: Home / Self Care | Payer: No Typology Code available for payment source | Attending: Emergency Medicine | Admitting: Emergency Medicine

## 2020-06-06 DIAGNOSIS — W228XXA Striking against or struck by other objects, initial encounter: Secondary | ICD-10-CM | POA: Diagnosis not present

## 2020-06-06 DIAGNOSIS — F17290 Nicotine dependence, other tobacco product, uncomplicated: Secondary | ICD-10-CM | POA: Insufficient documentation

## 2020-06-06 DIAGNOSIS — Y929 Unspecified place or not applicable: Secondary | ICD-10-CM | POA: Insufficient documentation

## 2020-06-06 DIAGNOSIS — Y999 Unspecified external cause status: Secondary | ICD-10-CM | POA: Diagnosis not present

## 2020-06-06 DIAGNOSIS — Y939 Activity, unspecified: Secondary | ICD-10-CM | POA: Insufficient documentation

## 2020-06-06 DIAGNOSIS — S060X0A Concussion without loss of consciousness, initial encounter: Secondary | ICD-10-CM | POA: Diagnosis not present

## 2020-06-06 DIAGNOSIS — M542 Cervicalgia: Secondary | ICD-10-CM | POA: Diagnosis not present

## 2020-06-06 DIAGNOSIS — S0990XA Unspecified injury of head, initial encounter: Secondary | ICD-10-CM

## 2020-06-06 NOTE — ED Notes (Signed)
Patient verbalizes understanding of discharge instructions. Opportunity for questioning and answers were provided. Armband removed by staff, pt discharged from ED to home with family 

## 2020-06-06 NOTE — Discharge Instructions (Signed)
Please read the attachment on concussion.  You need to follow-up with your OB/GYN on Monday morning, as scheduled.  Please read the following instructions: 1. Medications: Ibuprofen or Tylenol for pain 2. Treatment: Rest, can use ice on head. Recommend that you remain in a quiet, not simulating, dark environment. No TV, computer use, video games until headache is resolved completely. No contact sports until cleared by your pediatrician or PCP. 3. Follow Up: With primary care physician on Monday if headache persists.  Return to the emergency department if you become lethargic, begin vomiting, develop double vision, speech difficulty, problems walking, or other change in mental status.

## 2020-06-06 NOTE — ED Triage Notes (Signed)
Patient complains of headache and neck pain after being hit in head yesterday at work by metal pole, denies loc. No abrasion, nor lac. Alert and oriented

## 2020-06-06 NOTE — ED Provider Notes (Signed)
[facility] EMERGENCY DEPARTMENT Provider Note   CSN: [id number] Arrival date & time: 06/06/20  9628     History No chief complaint on file.   [name] [name] is a 24 y.o. female without any significant past medical history who presents the ED with complaints of headache and neck pain after being struck in the head.  Patient reports that she was at work yesterday grabbing something from a shelf when a metal pole from the shelf above rolled off and landed on her head. She denies any LOC or memory disturbance. She is not on blood thinners. She had to sit down after the incident and felt as though her vision was "going out". It quickly returned and visual acuity is grossly intact. She was sent home from work yesterday and advised to come to the ED today by her employer. She also was endorsing some left-sided neck discomfort subsequent to the injury. She is concerned about concussion. She typically lives alone, however is at home with her mother at this time. She denies any other injury, difficulty walking, LOC or seizure, memory disturbance, nausea or vomiting, somnolence, or other concerning symptoms/history.  HPI     Past Medical History:  Diagnosis Date  . Gall bladder inflammation   . GERD (gastroesophageal reflux disease)   . Medical history non-contributory     Patient Active Problem List   Diagnosis Date Noted  . Pregnancy 09/18/2015    Past Surgical History:  Procedure Laterality Date  . CESAREAN SECTION N/A 09/18/2015   Procedure: CESAREAN SECTION;  Surgeon: Richarda Overlie, MD;  Location: [facility];  Service: Obstetrics;  Laterality: N/A;  . MOUTH SURGERY    . NO PAST SURGERIES       OB History    Gravida  1   Para  1   Term  1   Preterm      AB      Living  1     SAB      TAB      Ectopic      Multiple  0   Live Births  1           Family History  Problem Relation Age of Onset  . Diabetes Maternal Uncle   . Hypertension  Maternal Uncle   . Heart disease Maternal Uncle     Social History   Tobacco Use  . Smoking status: Light Tobacco Smoker    Types: Cigarettes, Cigars    Last attempt to quit: 01/14/2015    Years since quitting: 5.4  . Smokeless tobacco: Never Used  . Tobacco comment: 1/day  Vaping Use  . Vaping Use: Never used  Substance Use Topics  . Alcohol use: Yes    Comment: occ  . Drug use: No    Home Medications Prior to Admission medications   Medication Sig Start Date End Date Taking? Authorizing Provider  aspirin-acetaminophen-caffeine (EXCEDRIN MIGRAINE) [phone] MG tablet Take 1 tablet by mouth every 6 (six) hours as needed for headache.   Yes [provider]  Cyanocobalamin (B-12 PO) Take 1 tablet by mouth daily.   Yes [provider]  fluticasone (FLONASE) 50 MCG/ACT nasal spray Place 1 spray into both nostrils daily. Patient taking differently: Place 1 spray into both nostrils daily as needed for allergies.  03/02/20  Yes Darr, Veryl Speak, PA-C  Multiple Vitamin (MULTIVITAMIN WITH MINERALS) TABS tablet Take 1 tablet by mouth daily.   Yes [provider]  benzonatate (TESSALON  PERLES) 100 MG capsule Take 1 capsule (100 mg total) by mouth 3 (three) times daily as needed for cough (cough). Patient not taking: Reported on 06/06/2020 10/26/19   Muthersbaugh, Dahlia Client, PA-C  cetirizine (ZYRTEC ALLERGY) 10 MG tablet Take 1 tablet (10 mg total) by mouth daily. Patient not taking: Reported on 06/06/2020 03/02/20   Darr, Veryl Speak, PA-C  ibuprofen (ADVIL) 600 MG tablet Take 1 tablet (600 mg total) by mouth every 6 (six) hours as needed for fever, headache or moderate pain. Patient not taking: Reported on 06/06/2020 03/02/20   Darr, Veryl Speak, PA-C  Menthol (CEPACOL SORE THROAT) 5.4 MG LOZG Use as directed 1 lozenge (5.4 mg total) in the mouth or throat every 2 (two) hours as needed. Patient not taking: Reported on 06/06/2020 03/02/20   Darr, Veryl Speak, PA-C    Allergies    Bee venom  and Shellfish allergy  Review of Systems   Review of Systems  All other systems reviewed and are negative.   Physical Exam Updated Vital Signs BP 113/77 (BP Location: Left Arm)   Pulse 78   Temp 98.3 F (36.8 C) (Oral)   Resp 16   SpO2 98%   Physical Exam Vitals and nursing note reviewed. Exam conducted with a chaperone present.  Constitutional:      General: She is not in acute distress.    Appearance: She is not ill-appearing.  HENT:     Head: Normocephalic and atraumatic.     Comments: No palpable skull defects. No ecchymoses over area of injury. Eyes:     General: No scleral icterus.    Conjunctiva/sclera: Conjunctivae normal.  Neck:     Comments: ROM fully intact. No midline cervical tenderness to palpation. No overlying skin changes. TTP appreciated over left trapezial region. Cardiovascular:     Rate and Rhythm: Normal rate and regular rhythm.     Pulses: Normal pulses.     Heart sounds: Normal heart sounds.  Pulmonary:     Effort: Pulmonary effort is normal. No respiratory distress.     Breath sounds: Normal breath sounds. No wheezing.  Skin:    General: Skin is dry.     Capillary Refill: Capillary refill takes less than 2 seconds.  Neurological:     Mental Status: She is alert.     GCS: GCS eye subscore is 4. GCS verbal subscore is 5. GCS motor subscore is 6.     Comments: CN II through XII gross intact. PERRL and EOM intact. No nystagmus. Ambulates without ataxia or difficulty. Negative Romberg and cerebellar exams. Can move all extremities against resistance. Sensation intact throughout. Visual acuity grossly intact.  Psychiatric:        Mood and Affect: Mood normal.        Behavior: Behavior normal.        Thought Content: Thought content normal.     ED Results / Procedures / Treatments   Labs (all labs ordered are listed, but only abnormal results are displayed) Labs Reviewed - No data to display  EKG None  Radiology No results  found.  Procedures Procedures (including critical care time)  Medications Ordered in ED Medications - No data to display  ED Course  I have reviewed the triage vital signs and the nursing notes.  Pertinent labs & imaging results that were available during my care of the patient were reviewed by me and considered in my medical decision making (see chart for details).    MDM Rules/Calculators/A&P  Patient with head injury which did not cause of loss of consciousness but with persistent headache since the initial trauma.  No evidence of skull fracture on physical exam. Patient is not taking anticoagulants, is less than 65 and has no history of subarachnoid or subdural hemorrhage. Patient denies nausea, vomiting, amnesia, vision changes,cognitive or memory dysfunction and vertigo.  Patient with no focal neurological deficits on physical exam.  Discussed thoroughly symptoms to return to the emergency department including severe headaches, disequilibrium, vomiting, double vision, extremity weakness, difficulty ambulating, or any other concerning symptoms.  Discussed the likely etiology of patient's symptoms being concussive in nature.  Discussed the risk versus benefit of CT scan at this time I do not believe she warrants one. Patient agrees that CT is not indicated at this time.  Patient will be discharged with information pertaining to diagnosis and advised to use over-the-counter medications like NSAIDs and Tylenol for pain relief. Pt has also advised to not participate in contact sports until they are completely asymptomatic for at least 1 week or they are cleared by their doctor.   Patient already has an appointment scheduled with her OB/GYN on Monday morning. She is living with her mother who will monitor for changes in behavior or somnolence. Patient states that her headache symptoms are relatively well controlled with Excedrin. She understands very strict return  precautions.  Final Clinical Impression(s) / ED Diagnoses Final diagnoses:  Injury of head, initial encounter  Concussion without loss of consciousness, initial encounter    Rx / DC Orders ED Discharge Orders    None       Lorelee New, PA-C 06/22/20 [id number]    Margarita Grizzle, MD 06/24/20 1239

## 2020-07-23 ENCOUNTER — Ambulatory Visit (INDEPENDENT_AMBULATORY_CARE_PROVIDER_SITE_OTHER): Payer: Medicaid Other | Admitting: Advanced Practice Midwife

## 2020-07-23 ENCOUNTER — Other Ambulatory Visit (HOSPITAL_COMMUNITY)
Admission: RE | Admit: 2020-07-23 | Discharge: 2020-07-23 | Disposition: A | Discharge status: Home / Self Care | Payer: BLUE CROSS/BLUE SHIELD | Source: Ambulatory Visit | Attending: Advanced Practice Midwife | Admitting: Advanced Practice Midwife

## 2020-07-23 ENCOUNTER — Other Ambulatory Visit: Payer: Self-pay

## 2020-07-23 ENCOUNTER — Encounter: Payer: Self-pay | Admitting: Advanced Practice Midwife

## 2020-07-23 VITALS — BP 121/85 | HR 68 | Ht 60.0 in | Wt 240.0 lb

## 2020-07-23 DIAGNOSIS — Z0000 Encounter for general adult medical examination without abnormal findings: Secondary | ICD-10-CM | POA: Diagnosis not present

## 2020-07-23 DIAGNOSIS — Z3009 Encounter for other general counseling and advice on contraception: Secondary | ICD-10-CM

## 2020-07-23 DIAGNOSIS — Z113 Encounter for screening for infections with a predominantly sexual mode of transmission: Secondary | ICD-10-CM | POA: Diagnosis not present

## 2020-07-23 NOTE — Progress Notes (Signed)
Subjective:     [name] [name] is a 24 y.o. female here at [facility] for a routine exam.  Current complaints: None. She has regular menses with mild cramping that resolves after bleeding resolves.  She is using natural family planning with an app on her phone and is happy with this method for now.  Personal health questionnaire reviewed: yes.  Do you have a primary care provider? yes Do you feel safe at home? yes    Risk factors for chronic health problems: Discussed risk factors with patient today and prevention of long term health problems with management of these risk factors.  Smoking: Occasional Alchohol/how much: Occasional Pt BMI: Body mass index is 46.87 kg/m.   Gynecologic History Patient's last menstrual period was 07/14/2020 (exact date). Contraception: natural family planning Last Pap: 2019. Results were: normal Last mammogram: n/a  Obstetric History OB History  Gravida Para Term Preterm AB Living  1 1 1     1   SAB TAB Ectopic Multiple Live Births        0 1    # Outcome Date GA Lbr Len/2nd Weight Sex Delivery Anes PTL Lv  1 Term 09/18/15 [redacted]w[redacted]d  7 lb 10.1 oz (3.46 kg) M CS-LTranv EPI  LIV     Birth Comments: wnl and c/w GA     The following portions of the patient's history were reviewed and updated as appropriate: allergies, current medications, past family history, past medical history, past social history, past surgical history and problem list.  Review of Systems Pertinent items noted in HPI and remainder of comprehensive ROS otherwise negative.    Objective:   BP 121/85   Pulse 68   Ht 5' (1.524 m)   Wt 240 lb (108.9 kg)   LMP 07/14/2020 (Exact Date)   BMI 46.87 kg/m  VS reviewed, nursing note reviewed,  Constitutional: well developed, well nourished, no distress HEENT: normocephalic CV: normal rate Pulm/chest wall: normal effort Breast Exam:  performed: right breast normal without mass, skin or nipple changes or axillary nodes, left breast  normal without mass, skin or nipple changes or axillary nodes Abdomen: soft Neuro: alert and oriented x 3 Skin: warm, dry Psych: affect normal Pelvic exam: Deferred      Assessment/Plan:   1. Well woman exam (no gynecological exam)   2. Routine screening for STI (sexually transmitted infection)  - Cervicovaginal ancillary only( [place]) - HIV Antibody (routine testing w rflx) - RPR - Hepatitis C antibody - Hepatitis B surface antigen  3. Encounter for general counseling and advice on contraceptive management --Discussed LARCs as most effective forms of birth control.  Discussed benefits/risks of other methods.  Pt desires to continue natural family planning/rhythm or calendar method.  Higher success rates with regular menses, which pt reports.  Pt may consider other options in the future.     Follow up in: 1 year or as needed.   07/16/2020, CNM 10:11 AM

## 2020-07-23 NOTE — Progress Notes (Signed)
Patient presents for Annual Exam.  LMP:07/14/20 lasting 3-4 days light flow with cramps  Last Pap: 05/12/2018 WNL  STD Screening: Full panel  Contraception: None  Family Hx of Breast Cancer: Cousin in late 20's on mother's side of the family.   CC: None

## 2020-07-23 NOTE — Patient Instructions (Signed)
Contraception Choices Contraception, also called birth control, refers to methods or devices that prevent pregnancy. Hormonal methods Contraceptive implant  A contraceptive implant is a thin, plastic tube that contains a hormone. It is inserted into the upper part of the arm. It can remain in place for up to 3 years. Progestin-only injections Progestin-only injections are injections of progestin, a synthetic form of the hormone progesterone. They are given every 3 months by a health care provider. Birth control pills  Birth control pills are pills that contain hormones that prevent pregnancy. They must be taken once a day, preferably at the same time each day. Birth control patch  The birth control patch contains hormones that prevent pregnancy. It is placed on the skin and must be changed once a week for three weeks and removed on the fourth week. A prescription is needed to use this method of contraception. Vaginal ring  A vaginal ring contains hormones that prevent pregnancy. It is placed in the vagina for three weeks and removed on the fourth week. After that, the process is repeated with a new ring. A prescription is needed to use this method of contraception. Emergency contraceptive Emergency contraceptives prevent pregnancy after unprotected sex. They come in pill form and can be taken up to 5 days after sex. They work best the sooner they are taken after having sex. Most emergency contraceptives are available without a prescription. This method should not be used as your only form of birth control. Barrier methods Female condom  A female condom is a thin sheath that is worn over the penis during sex. Condoms keep sperm from going inside a woman's body. They can be used with a spermicide to increase their effectiveness. They should be disposed after a single use. Female condom  A female condom is a soft, loose-fitting sheath that is put into the vagina before sex. The condom keeps sperm  from going inside a woman's body. They should be disposed after a single use. Diaphragm  A diaphragm is a soft, dome-shaped barrier. It is inserted into the vagina before sex, along with a spermicide. The diaphragm blocks sperm from entering the uterus, and the spermicide kills sperm. A diaphragm should be left in the vagina for 6-8 hours after sex and removed within 24 hours. A diaphragm is prescribed and fitted by a health care provider. A diaphragm should be replaced every 1-2 years, after giving birth, after gaining more than 15 lb (6.8 kg), and after pelvic surgery. Cervical cap  A cervical cap is a round, soft latex or plastic cup that fits over the cervix. It is inserted into the vagina before sex, along with spermicide. It blocks sperm from entering the uterus. The cap should be left in place for 6-8 hours after sex and removed within 48 hours. A cervical cap must be prescribed and fitted by a health care provider. It should be replaced every 2 years. Sponge  A sponge is a soft, circular piece of polyurethane foam with spermicide on it. The sponge helps block sperm from entering the uterus, and the spermicide kills sperm. To use it, you make it wet and then insert it into the vagina. It should be inserted before sex, left in for at least 6 hours after sex, and removed and thrown away within 30 hours. Spermicides Spermicides are chemicals that kill or block sperm from entering the cervix and uterus. They can come as a cream, jelly, suppository, foam, or tablet. A spermicide should be inserted into the   vagina with an applicator at least 10-15 minutes before sex to allow time for it to work. The process must be repeated every time you have sex. Spermicides do not require a prescription. Intrauterine contraception Intrauterine device (IUD) An IUD is a T-shaped device that is put in a woman's uterus. There are two types:  Hormone IUD.This type contains progestin, a synthetic form of the hormone  progesterone. This type can stay in place for 3-5 years.  Copper IUD.This type is wrapped in copper wire. It can stay in place for 10 years.  Permanent methods of contraception Female tubal ligation In this method, a woman's fallopian tubes are sealed, tied, or blocked during surgery to prevent eggs from traveling to the uterus. Hysteroscopic sterilization In this method, a small, flexible insert is placed into each fallopian tube. The inserts cause scar tissue to form in the fallopian tubes and block them, so sperm cannot reach an egg. The procedure takes about 3 months to be effective. Another form of birth control must be used during those 3 months. Female sterilization This is a procedure to tie off the tubes that carry sperm (vasectomy). After the procedure, the man can still ejaculate fluid (semen). Natural planning methods Natural family planning In this method, a couple does not have sex on days when the woman could become pregnant. Calendar method This means keeping track of the length of each menstrual cycle, identifying the days when pregnancy can happen, and not having sex on those days. Ovulation method In this method, a couple avoids sex during ovulation. Symptothermal method This method involves not having sex during ovulation. The woman typically checks for ovulation by watching changes in her temperature and in the consistency of cervical mucus. Post-ovulation method In this method, a couple waits to have sex until after ovulation. Summary  Contraception, also called birth control, means methods or devices that prevent pregnancy.  Hormonal methods of contraception include implants, injections, pills, patches, vaginal rings, and emergency contraceptives.  Barrier methods of contraception can include female condoms, female condoms, diaphragms, cervical caps, sponges, and spermicides.  There are two types of IUDs (intrauterine devices). An IUD can be put in a woman's uterus to  prevent pregnancy for 3-5 years.  Permanent sterilization can be done through a procedure for males, females, or both.  Natural family planning methods involve not having sex on days when the woman could become pregnant. This information is not intended to replace advice given to you by your health care provider. Make sure you discuss any questions you have with your health care provider. Document Revised: 10/14/2017 Document Reviewed: 11/14/2016 Elsevier Patient Education  2020 [employer] Inc.  

## 2020-07-24 LAB — RPR: RPR Ser Ql: NONREACTIVE

## 2020-07-24 LAB — CERVICOVAGINAL ANCILLARY ONLY
Bacterial Vaginitis (gardnerella): POSITIVE — AB
Candida Glabrata: NEGATIVE
Candida Vaginitis: POSITIVE — AB
Chlamydia: NEGATIVE
Comment: NEGATIVE
Comment: NEGATIVE
Comment: NEGATIVE
Comment: NEGATIVE
Comment: NEGATIVE
Comment: NORMAL
Neisseria Gonorrhea: NEGATIVE
Trichomonas: NEGATIVE

## 2020-07-24 LAB — HEPATITIS C ANTIBODY: Hep C Virus Ab: <0.1 s/co ratio (ref 0.0–0.9)

## 2020-07-24 LAB — HEPATITIS B SURFACE ANTIGEN: Hepatitis B Surface Ag: NEGATIVE

## 2020-07-24 LAB — HIV ANTIBODY (ROUTINE TESTING W REFLEX): HIV Screen 4th Generation wRfx: NONREACTIVE

## 2020-07-26 ENCOUNTER — Other Ambulatory Visit: Payer: Self-pay

## 2020-07-26 MED ORDER — METRONIDAZOLE 500 MG PO TABS
500.0000 mg | ORAL_TABLET | Freq: Two times a day (BID) | ORAL | 0 refills | Status: DC
Start: 2020-07-26 — End: 2020-10-14

## 2020-07-26 MED ORDER — FLUCONAZOLE 150 MG PO TABS: ORAL_TABLET | ORAL | 0 refills | Status: DC

## 2020-10-14 ENCOUNTER — Ambulatory Visit (INDEPENDENT_AMBULATORY_CARE_PROVIDER_SITE_OTHER): Payer: Medicaid Other

## 2020-10-14 ENCOUNTER — Other Ambulatory Visit: Payer: Self-pay

## 2020-10-14 VITALS — Ht 61.0 in | Wt 248.1 lb

## 2020-10-14 DIAGNOSIS — N912 Amenorrhea, unspecified: Secondary | ICD-10-CM

## 2020-10-14 DIAGNOSIS — Z3491 Encounter for supervision of normal pregnancy, unspecified, first trimester: Secondary | ICD-10-CM

## 2020-10-14 LAB — POCT URINE PREGNANCY: Preg Test, Ur: POSITIVE — AB

## 2020-10-14 MED ORDER — BLOOD PRESSURE KIT DEVI: 1.0000 | 0 refills | Status: AC

## 2020-10-14 NOTE — Progress Notes (Signed)
Ms. Quant presents today for UPT. She has no unusual complaints. LMP:    OBJECTIVE: Appears well, in no apparent distress.  OB History    Gravida  2   Para  1   Term  1   Preterm      AB      Living  1     SAB      IAB      Ectopic      Multiple  0   Live Births  1          Home UPT Result:positive In-Office UPT result: positive I have reviewed the patient's medical, obstetrical, social, and family histories, and medications.   ASSESSMENT: Positive pregnancy test  PLAN Prenatal care to be completed at: [facility] Ordered Blood pressure kit sent to pharmacy PHQ 2 score: 0 GAD 7 score: 4

## 2020-10-14 NOTE — Progress Notes (Signed)
Patient was assessed and managed by nursing staff during this encounter. I have reviewed the chart and agree with the documentation and plan. I have also made any necessary editorial changes.  Catalina Antigua, MD 10/14/2020 4:03 PM

## 2020-11-07 ENCOUNTER — Ambulatory Visit (INDEPENDENT_AMBULATORY_CARE_PROVIDER_SITE_OTHER): Payer: Medicaid Other

## 2020-11-07 ENCOUNTER — Other Ambulatory Visit: Payer: Self-pay

## 2020-11-07 VITALS — BP 119/78 | HR 80 | Ht 61.0 in | Wt 245.2 lb

## 2020-11-07 DIAGNOSIS — O3680X0 Pregnancy with inconclusive fetal viability, not applicable or unspecified: Secondary | ICD-10-CM | POA: Diagnosis not present

## 2020-11-07 DIAGNOSIS — Z789 Other specified health status: Secondary | ICD-10-CM | POA: Diagnosis not present

## 2020-11-07 DIAGNOSIS — O219 Vomiting of pregnancy, unspecified: Secondary | ICD-10-CM

## 2020-11-07 DIAGNOSIS — Z3491 Encounter for supervision of normal pregnancy, unspecified, first trimester: Secondary | ICD-10-CM

## 2020-11-07 MED ORDER — DOXYLAMINE-PYRIDOXINE 10-10 MG PO TBEC: 2.0000 | DELAYED_RELEASE_TABLET | Freq: Every day | ORAL | 5 refills | Status: DC

## 2020-11-07 NOTE — Progress Notes (Signed)
Patient was assessed and managed by nursing staff during this encounter. I have reviewed the chart and agree with the documentation and plan. I have also made any necessary editorial changes.  Jomayra Novitsky, MD 11/07/2020 12:57 PM    

## 2020-11-07 NOTE — Progress Notes (Signed)
DATING AND VIABILITY SONOGRAM   [name] [name] is a 25 y.o. year old G2P1001 with LMP Patient's last menstrual period was 09/04/2020. which would correlate to  [redacted]w[redacted]d weeks gestation.  She has regular menstrual cycles.   She is here today for a confirmatory initial sonogram.    GESTATION: SINGLETON Pregnancy  FETAL ACTIVITY:          Heart rate         160          The fetus is active.   GESTATIONAL AGE AND  BIOMETRICS:  Gestational criteria: Estimated Date of Delivery: 06/11/21 by LMP now at [redacted]w[redacted]d  Previous Scans:0  GESTATIONAL SAC    CROWN RUMP LENGTH           22.7 mm         8 weeks                                                                               AVERAGE EGA(BY THIS SCAN):  8 weeks  WORKING EDD( LMP ):  06/11/21     TECHNICIAN COMMENTS:  Single live IUP at [redacted]w[redacted]d by CRL. FHR 160   A copy of this report including all images has been saved and backed up to a second source for retrieval if needed. All measures and details of the anatomical scan, placentation, fluid volume and pelvic anatomy are contained in that report.  [name] [name] 11/07/2020 10:24 AM

## 2020-11-14 ENCOUNTER — Other Ambulatory Visit (HOSPITAL_COMMUNITY)
Admission: RE | Admit: 2020-11-14 | Discharge: 2020-11-14 | Disposition: A | Discharge status: Home / Self Care | Payer: Medicaid Other | Source: Ambulatory Visit

## 2020-11-14 ENCOUNTER — Other Ambulatory Visit: Payer: Self-pay

## 2020-11-14 ENCOUNTER — Ambulatory Visit (INDEPENDENT_AMBULATORY_CARE_PROVIDER_SITE_OTHER): Payer: Medicaid Other

## 2020-11-14 VITALS — BP 105/63 | HR 72 | Wt 249.0 lb

## 2020-11-14 DIAGNOSIS — O99210 Obesity complicating pregnancy, unspecified trimester: Secondary | ICD-10-CM

## 2020-11-14 DIAGNOSIS — Z3A10 10 weeks gestation of pregnancy: Secondary | ICD-10-CM

## 2020-11-14 DIAGNOSIS — Z124 Encounter for screening for malignant neoplasm of cervix: Secondary | ICD-10-CM | POA: Insufficient documentation

## 2020-11-14 DIAGNOSIS — Z3481 Encounter for supervision of other normal pregnancy, first trimester: Secondary | ICD-10-CM

## 2020-11-14 DIAGNOSIS — Z98891 History of uterine scar from previous surgery: Secondary | ICD-10-CM

## 2020-11-14 NOTE — Progress Notes (Signed)
New OB, reports no problems today.  Declined FLU vaccine.

## 2020-11-14 NOTE — Progress Notes (Signed)
Subjective:   [name] [name] is a 25 y.o. G2P1001 at 76w1dby Definite LMP being seen today for her first obstetrical visit. She reports this was not a planned pregnancy and was not taking birth control prior to conception.    Gynecological/Obstetrical History: Her obstetrical history is significant for obesity and Primary C/S d/t CPD. Patient does not intend to breast feed. Pregnancy history fully reviewed. She is unsure if she will have repeat C/S or attempt a TOLAC. Patient reports no complaints. Sexual Activity and Vaginal Concerns: Patient denies vaginal discharge, bleeding, or leaking.  She denies pain or discomfort during sexual activity.   Medical History/ROS:She denies pain or difficulty with urination.  She also denies constipation or diarrhea.   Social History: Patient endorses history of tobacco usage with cessation at pregnancy discovery.  Patient denies history or current usage of tobacco, alcohol, or drugs.  Patient reports the FOB is DMarylene Landwho is involved and supportive.  Patient reports that she lives with DMendel Ryderand son and endorses safety at home.  Patient denies DV/A. 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   HISTORY: OB History  Gravida Para Term Preterm AB Living  2 1 1  0 0 1  SAB IAB Ectopic Multiple Live Births  0 0 0 0 1    # Outcome Date GA Lbr Len/2nd Weight Sex Delivery Anes PTL Lv  2 Current           1 Term 09/18/15 [id number] lb 10.1 oz (3.46 kg) M CS-LTranv EPI  LIV     Birth Comments: wnl and c/w GA     Name: Edmunds,BOY Tanina     Apgar1: 8  Apgar5: 9    Last pap smear was collected today.  Past Medical History:  Diagnosis Date  . Gall bladder inflammation   . GERD (gastroesophageal reflux disease)   . Medical history non-contributory   . Migraines    Past Surgical History:  Procedure Laterality Date  . CESAREAN SECTION N/A 09/18/2015   Procedure:  CESAREAN SECTION;  Surgeon: RiMolli PoseyMD;  [facility];  Service: Obstetrics;  Laterality: N/A;  . MOUTH SURGERY    . NO PAST SURGERIES     Family History  Problem Relation Age of Onset  . Diabetes Maternal Uncle   . Hypertension Maternal Uncle   . Heart disease Maternal Uncle    Social History   Tobacco Use  . Smoking status: Light Tobacco Smoker    Types: Cigarettes, Cigars    Last attempt to quit: 01/14/2015    Years since quitting: 5.8  . Smokeless tobacco: Never Used  . Tobacco comment: stopped after confirmed pregnancy  Vaping Use  . Vaping Use: Never used  Substance Use Topics  . Alcohol use: Not Currently    Comment: occ  . Drug use: No   Allergies  Allergen Reactions  . Bee Venom   . Shellfish Allergy Hives   Current Outpatient Medications on File Prior to Visit  Medication Sig Dispense Refill  . aspirin-acetaminophen-caffeine (EXCEDRIN MIGRAINE) 250-250-65 MG tablet Take 1 tablet by mouth every 6 (six) hours as needed for headache. (Patient not taking: No sig reported)    . Blood Pressure Monitoring (BLOOD PRESSURE KIT) DEVI 1 kit by Does not apply route once a week. 1 each 0  . cetirizine (ZYRTEC ALLERGY) 10 MG tablet Take 1 tablet (10 mg  total) by mouth daily. (Patient not taking: Reported on 10/14/2020) 30 tablet 0  . Cyanocobalamin (B-12 PO) Take 1 tablet by mouth daily.    . Doxylamine-Pyridoxine (DICLEGIS) 10-10 MG TBEC Take 2 tablets by mouth at bedtime. If symptoms persist, add one tablet in the morning and one in the afternoon 100 tablet 5  . fluticasone (FLONASE) 50 MCG/ACT nasal spray Place 1 spray into both nostrils daily. 18.2 mL 2   No current facility-administered medications on file prior to visit.    Review of Systems Pertinent items noted in HPI and remainder of comprehensive ROS otherwise negative.  Exam   Vitals:   11/14/20 0929  BP: 105/63  Pulse: 72  Weight: 249 lb (112.9 kg)   Fetal Heart Rate (bpm): 151  Physical  Exam Constitutional:      General: She is not in acute distress.    Appearance: Normal appearance. She is obese.  Genitourinary:     Right Labia: No tenderness.    Left Labia: No tenderness.    Vaginal discharge present.     Cervical friability (After pap collected with brush and spatula) present.     No cervical motion tenderness, polyp or nabothian cyst.     Uterus exam comments: Uterine size difficult to appreciate s/t habitus.  Some enlargement noted. .  Breasts:     Right: No swelling, mass, skin change, tenderness or axillary adenopathy.     Left: No swelling, mass, skin change, tenderness or axillary adenopathy.    HENT:     Head: Normocephalic and atraumatic.  Eyes:     Conjunctiva/sclera: Conjunctivae normal.  Neck:     Thyroid: No thyroid mass, thyromegaly or thyroid tenderness.  Cardiovascular:     Rate and Rhythm: Normal rate and regular rhythm.     Heart sounds: Normal heart sounds.  Pulmonary:     Effort: Pulmonary effort is normal. No respiratory distress.     Breath sounds: Normal breath sounds.  Abdominal:     Palpations: Abdomen is soft.     Tenderness: There is no abdominal tenderness.  Musculoskeletal:        General: Normal range of motion.     Cervical back: Normal range of motion. No rigidity.  Lymphadenopathy:     Upper Body:     Right upper body: No axillary adenopathy.     Left upper body: No axillary adenopathy.  Neurological:     Mental Status: She is alert and oriented to person, place, and time.  Skin:    General: Skin is warm and dry.  Psychiatric:        Mood and Affect: Mood normal.        Behavior: Behavior normal.        Thought Content: Thought content normal.  Vitals reviewed.     Assessment:   25 y.o. year old G2P1001 Patient Active Problem List   Diagnosis Date Noted  . Encounter for supervision of normal pregnancy in first trimester 10/14/2020  . Pregnancy 09/18/2015     Plan:  1. Encounter for supervision of other  normal pregnancy in first trimester -Congratulations given and patient welcomed to practice. -Discussed usage of Babyscripts and virtual visits as additional source of managing and completing PN visits in midst of coronavirus.   *Instructed to take blood pressure and record weekly into babyscripts. *Reviewed modified prenatal visit schedule and platforms used for virtual visits.  -Anticipatory guidance for prenatal visits including labs, ultrasounds, and testing; Initial labs drawn. -Genetic Screening  discussed, First trimester screen, Quad screen and NIPS: ordered. -Encouraged to complete MyChart Registration for her ability to review results, send requests, and have questions addressed.  -Discussed estimated due date of August 17, 202. -Ultrasound discussed; fetal anatomic survey: Reviewed, but not ordered. -Continue prenatal vitamins -Influenza offered and declined. -Encouraged to seek out care at office or emergency room for urgent and/or emergent concerns. -Educated on the nature of [facility] with multiple MDs and other Advanced Practice Providers was explained to patient; also emphasized that residents, students are part of our team. Informed of her right to refuse care as she deems appropriate.  -No questions or concerns.    2. [redacted] weeks gestation of pregnancy -Doing well overall. -Plan to RTO in 4 weeks via virtual visit. -Will schedule anatomy US after that time.   3. Routine cervical smear -Pap collected -Cautioned that blood noted after. -Instructed to abstain from sex for at least 24 hours.   4. Obesity in pregnancy, antepartum -CMP and HgB A1C collected. -Will review criteria for bASA and start as appropriate.  5. History of C-section -Brief discussion of desire for repeat vs TOLAC.   Problem list reviewed and updated. Routine obstetric precautions reviewed.  Orders Placed This Encounter  Procedures  . Culture, OB Urine  .  CBC/D/Plt+RPR+Rh+ABO+Rub Ab...  . Genetic Screening  . Comp Met (CMET)  . HgB A1c    Return in about 4 weeks (around 12/12/2020) for Virtual LR-ROB.     Maryann Conners, CNM 11/14/2020 10:00 AM

## 2020-11-14 NOTE — Patient Instructions (Signed)
How to Take Your Blood Pressure Blood pressure is a measurement of how strongly your blood is pressing against the walls of your arteries. Arteries are blood vessels that carry blood from your heart throughout your body. Your health care provider takes your blood pressure at each office visit. You can also take your own blood pressure at home with a blood pressure monitor. You may need to take your own blood pressure to:  Confirm a diagnosis of high blood pressure (hypertension).  Monitor your blood pressure over time.  Make sure your blood pressure medicine is working. Supplies needed:  Blood pressure monitor.  Dining room chair to sit in.  Table or desk.  Small notebook and pencil or pen. How to prepare To get the most accurate reading, avoid the following for 30 minutes before you check your blood pressure:  Drinking caffeine.  Drinking alcohol.  Eating.  Smoking.  Exercising. Five minutes before you check your blood pressure:  Use the bathroom and urinate so that you have an empty bladder.  Sit quietly in a dining room chair. Do not sit in a soft couch or an armchair. Do not talk. How to take your blood pressure To check your blood pressure, follow the instructions in the manual that came with your blood pressure monitor. If you have a digital blood pressure monitor, the instructions may be as follows: 1. Sit up straight in a chair. 2. Place your feet on the floor. Do not cross your ankles or legs. 3. Rest your left arm at the level of your heart on a table or desk or on the arm of a chair. 4. Pull up your shirt sleeve. 5. Wrap the blood pressure cuff around the upper part of your left arm, 1 inch (2.5 cm) above your elbow. It is best to wrap the cuff around bare skin. 6. Fit the cuff snugly around your arm. You should be able to place only one finger between the cuff and your arm. 7. Position the cord so that it rests in the bend of your elbow. 8. Press the power  button. 9. Sit quietly while the cuff inflates and deflates. 10. Read the digital reading on the monitor screen and write the numbers down (record them) in a notebook. 11. Wait 2-3 minutes, then repeat the steps, starting at step 1.   What does my blood pressure reading mean? A blood pressure reading consists of a higher number over a lower number. Ideally, your blood pressure should be below 120/80. The first ("top") number is called the systolic pressure. It is a measure of the pressure in your arteries as your heart beats. The second ("bottom") number is called the diastolic pressure. It is a measure of the pressure in your arteries as the heart relaxes. Blood pressure is classified into five stages. The following are the stages for adults who do not have a short-term serious illness or a chronic condition. Systolic pressure and diastolic pressure are measured in a unit called mm Hg (millimeters of mercury).  Normal  Systolic pressure: below 120.  Diastolic pressure: below 80. Elevated  Systolic pressure: 120-129.  Diastolic pressure: below 80. Hypertension stage 1  Systolic pressure: 130-139.  Diastolic pressure: 80-89. Hypertension stage 2  Systolic pressure: 140 or above.  Diastolic pressure: 90 or above. You can have elevated blood pressure or hypertension even if only the systolic or only the diastolic number in your reading is higher than normal. Follow these instructions at home:  Check your blood   pressure as often as recommended by your health care provider.  Check your blood pressure at the same time every day.  Take your monitor to the next appointment with your health care provider to make sure that: ? You are using it correctly. ? It provides accurate readings.  Be sure you understand what your goal blood pressure numbers are.  Tell your health care provider if you are having any side effects from blood pressure medicine.  Keep all follow-up visits as told by  your health care provider. This is important. General tips  Your health care provider can suggest a reliable monitor that will meet your needs. There are several types of home blood pressure monitors.  Choose a monitor that has an arm cuff. Do not choose a monitor that measures your blood pressure from your wrist or finger.  Choose a cuff that wraps snugly around your upper arm. You should be able to fit only one finger between your arm and the cuff.  You can buy a blood pressure monitor at most drugstores or online. Where to find more information American Heart Association: www.heart.org Contact a health care provider if:  Your blood pressure is consistently high. Get help right away if:  Your systolic blood pressure is higher than 180.  Your diastolic blood pressure is higher than 120. Summary  Blood pressure is a measurement of how strongly your blood is pressing against the walls of your arteries.  A blood pressure reading consists of a higher number over a lower number. Ideally, your blood pressure should be below 120/80.  Check your blood pressure at the same time every day.  Avoid caffeine, alcohol, smoking, and exercise for 30 minutes prior to checking your blood pressure. These agents can affect the accuracy of the blood pressure reading. This information is not intended to replace advice given to you by your health care provider. Make sure you discuss any questions you have with your health care provider. Document Revised: 10/06/2019 Document Reviewed: 10/06/2019 Elsevier Patient Education  2021 Elsevier Inc.  

## 2020-11-15 LAB — COMPREHENSIVE METABOLIC PANEL
ALT: 13 IU/L (ref 0–32)
AST: 15 IU/L (ref 0–40)
Albumin/Globulin Ratio: 1.5 (ref 1.2–2.2)
Albumin: 4 g/dL (ref 3.9–5.0)
Alkaline Phosphatase: 65 IU/L (ref 44–121)
BUN/Creatinine Ratio: 13 (ref 9–23)
BUN: 8 mg/dL (ref 6–20)
Bilirubin Total: 0.6 mg/dL (ref 0.0–1.2)
CO2: 22 mmol/L (ref 20–29)
Calcium: 9.2 mg/dL (ref 8.7–10.2)
Chloride: 101 mmol/L (ref 96–106)
Creatinine, Ser: 0.64 mg/dL (ref 0.57–1.00)
GFR calc Af Amer: 142 mL/min/{1.73_m2} (ref >59)
GFR calc non Af Amer: 124 mL/min/{1.73_m2} (ref >59)
Globulin, Total: 2.6 g/dL (ref 1.5–4.5)
Glucose: 72 mg/dL (ref 65–99)
Potassium: 3.7 mmol/L (ref 3.5–5.2)
Sodium: 137 mmol/L (ref 134–144)
Total Protein: 6.6 g/dL (ref 6.0–8.5)

## 2020-11-15 LAB — CYTOLOGY - PAP
Comment: NEGATIVE
Diagnosis: NEGATIVE
High risk HPV: NEGATIVE

## 2020-11-15 LAB — CBC/D/PLT+RPR+RH+ABO+RUB AB...
Antibody Screen: NEGATIVE
Basophils Absolute: 0 10*3/uL (ref 0.0–0.2)
Basos: 0 %
EOS (ABSOLUTE): 0.3 10*3/uL (ref 0.0–0.4)
Eos: 6 %
HCV Ab: <0.1 s/co ratio (ref 0.0–0.9)
HIV Screen 4th Generation wRfx: NONREACTIVE
Hematocrit: 34.6 % (ref 34.0–46.6)
Hemoglobin: 11.5 g/dL (ref 11.1–15.9)
Hepatitis B Surface Ag: NEGATIVE
Immature Grans (Abs): 0 10*3/uL (ref 0.0–0.1)
Immature Granulocytes: 0 %
Lymphocytes Absolute: 1.3 10*3/uL (ref 0.7–3.1)
Lymphs: 22 %
MCH: 28 pg (ref 26.6–33.0)
MCHC: 33.2 g/dL (ref 31.5–35.7)
MCV: 84 fL (ref 79–97)
Monocytes Absolute: 0.6 10*3/uL (ref 0.1–0.9)
Monocytes: 9 %
Neutrophils Absolute: 3.8 10*3/uL (ref 1.4–7.0)
Neutrophils: 63 %
Platelets: 247 10*3/uL (ref 150–450)
RBC: 4.1 x10E6/uL (ref 3.77–5.28)
RDW: 14.8 % (ref 11.7–15.4)
RPR Ser Ql: NONREACTIVE
Rh Factor: POSITIVE
Rubella Antibodies, IGG: <0.9 index — ABNORMAL LOW (ref >0.99)
WBC: 6 10*3/uL (ref 3.4–10.8)

## 2020-11-15 LAB — CERVICOVAGINAL ANCILLARY ONLY
Bacterial Vaginitis (gardnerella): NEGATIVE
Candida Glabrata: NEGATIVE
Candida Vaginitis: NEGATIVE
Chlamydia: NEGATIVE
Comment: NEGATIVE
Comment: NEGATIVE
Comment: NEGATIVE
Comment: NEGATIVE
Comment: NEGATIVE
Comment: NORMAL
Neisseria Gonorrhea: NEGATIVE
Trichomonas: NEGATIVE

## 2020-11-15 LAB — HCV INTERPRETATION

## 2020-11-15 LAB — HEMOGLOBIN A1C
Est. average glucose Bld gHb Est-mCnc: 111 mg/dL
Hgb A1c MFr Bld: 5.5 % (ref 4.8–5.6)

## 2020-11-16 DIAGNOSIS — O99210 Obesity complicating pregnancy, unspecified trimester: Secondary | ICD-10-CM | POA: Insufficient documentation

## 2020-11-16 DIAGNOSIS — Z6841 Body Mass Index (BMI) 40.0 and over, adult: Secondary | ICD-10-CM | POA: Insufficient documentation

## 2020-11-16 DIAGNOSIS — Z283 Underimmunization status: Secondary | ICD-10-CM | POA: Insufficient documentation

## 2020-11-16 DIAGNOSIS — Z2839 Other underimmunization status: Secondary | ICD-10-CM | POA: Insufficient documentation

## 2020-11-16 LAB — URINE CULTURE, OB REFLEX

## 2020-11-16 LAB — CULTURE, OB URINE

## 2020-11-18 ENCOUNTER — Encounter: Payer: Medicaid Other | Admitting: Obstetrics and Gynecology

## 2020-11-22 ENCOUNTER — Other Ambulatory Visit: Payer: Self-pay

## 2020-11-22 ENCOUNTER — Encounter (HOSPITAL_COMMUNITY): Payer: Self-pay | Admitting: Obstetrics & Gynecology

## 2020-11-22 ENCOUNTER — Inpatient Hospital Stay (HOSPITAL_COMMUNITY)
Admission: AD | Admit: 2020-11-22 | Discharge: 2020-11-22 | Disposition: A | Discharge status: Home / Self Care | Payer: Medicaid Other | Attending: Obstetrics & Gynecology | Admitting: Obstetrics & Gynecology

## 2020-11-22 DIAGNOSIS — O99511 Diseases of the respiratory system complicating pregnancy, first trimester: Secondary | ICD-10-CM | POA: Insufficient documentation

## 2020-11-22 DIAGNOSIS — Z3A00 Weeks of gestation of pregnancy not specified: Secondary | ICD-10-CM | POA: Diagnosis not present

## 2020-11-22 DIAGNOSIS — O99891 Other specified diseases and conditions complicating pregnancy: Secondary | ICD-10-CM

## 2020-11-22 DIAGNOSIS — Z3481 Encounter for supervision of other normal pregnancy, first trimester: Secondary | ICD-10-CM

## 2020-11-22 DIAGNOSIS — Z8616 Personal history of COVID-19: Secondary | ICD-10-CM | POA: Insufficient documentation

## 2020-11-22 DIAGNOSIS — Z20822 Contact with and (suspected) exposure to covid-19: Secondary | ICD-10-CM | POA: Diagnosis not present

## 2020-11-22 DIAGNOSIS — Z3A11 11 weeks gestation of pregnancy: Secondary | ICD-10-CM | POA: Insufficient documentation

## 2020-11-22 DIAGNOSIS — R0981 Nasal congestion: Secondary | ICD-10-CM | POA: Insufficient documentation

## 2020-11-22 NOTE — MAU Provider Note (Signed)
Event Date/Time   First Provider Initiated Contact with Patient 11/22/20 1606      S [name] is a 25 y.o. G2P1001 patient who presents to MAU today with complaint of nasal congestion, trouble breathing through her nose. She reports she has to mouth breath. She states the the congestion is worse when she is up moving around or when she is going to bed. She has tried a nasal spray that her PCP Rx'd to her (name unknown) and her mom made her a herbal steam. She does report any relief from either. She reports having COVID-19 the week of Christmas. She denies any pregnancy related complaints.   O BP 112/70 (BP Location: Right Arm)   Pulse 84   Temp 98.3 F (36.8 C) (Oral)   Resp 20   Ht 5\' 1"  (1.549 m)   Wt 114.3 kg   LMP 09/04/2020   SpO2 100%   BMI 47.60 kg/m    Physical Exam Vitals and nursing note reviewed.  Constitutional:      Appearance: Normal appearance. She is obese.  HENT:     Head: Normocephalic and atraumatic.     Nose: Congestion present.  Cardiovascular:     Rate and Rhythm: Normal rate.  Pulmonary:     Effort: Pulmonary effort is normal.  Genitourinary:    Comments: Not indicated Musculoskeletal:        General: Normal range of motion.     Cervical back: Normal range of motion.  Neurological:     Mental Status: She is alert and oriented to person, place, and time.  Psychiatric:        Mood and Affect: Mood normal.        Behavior: Behavior normal.        Thought Content: Thought content normal.        Judgment: Judgment normal.     A Medical screening exam complete Nasal congestion   P Discharge from MAU in stable condition Patient given the option to seek care at Urgent Care List of Safe of Medications in Pregnancy List of options for follow-up given Warning signs for worsening condition that would warrant emergency follow-up discussed Patient may return to MAU for OB emergencies prn   13/07/2020, CNM 11/22/2020 4:07 PM

## 2020-11-22 NOTE — MAU Note (Addendum)
Presents stating she's having trouble breathing.  States she can't breathe through her nose, has to have her mouth open to breathe.  States has nasal congestion.  Reports Covid+ in December 2021. Denies VB and pain.

## 2020-11-25 ENCOUNTER — Telehealth: Payer: Self-pay

## 2020-11-25 NOTE — Telephone Encounter (Signed)
RETURN CALL TO PT REGARDING VM. PT STATES SHE IS NOT ABLE TO SEE RESULTS AND SHE RECEIVED A NOTIFICATION TODAY.  IN SEARCHING FOR WHAT PT WAS REFERRING TO  PT HUNG UP  PER FRONT DESK PT HAD HUNG UP ON THEM ONE TIME TODAY.  PT CAN CALL BACK IF ASSISTANCE IS STILL NEEDED.

## 2020-12-12 ENCOUNTER — Other Ambulatory Visit: Payer: Self-pay

## 2020-12-12 ENCOUNTER — Ambulatory Visit (INDEPENDENT_AMBULATORY_CARE_PROVIDER_SITE_OTHER): Payer: Medicaid Other | Admitting: Obstetrics and Gynecology

## 2020-12-12 VITALS — BP 119/67 | HR 89 | Wt 255.0 lb

## 2020-12-12 DIAGNOSIS — Z3481 Encounter for supervision of other normal pregnancy, first trimester: Secondary | ICD-10-CM

## 2020-12-12 MED ORDER — ONDANSETRON HCL 4 MG PO TABS: 4.0000 mg | ORAL_TABLET | Freq: Three times a day (TID) | ORAL | 0 refills | Status: DC | PRN

## 2020-12-12 MED ORDER — ASPIRIN 81 MG PO CHEW: 81.0000 mg | CHEWABLE_TABLET | Freq: Every day | ORAL | 2 refills | Status: DC

## 2020-12-12 NOTE — Progress Notes (Signed)
   PRENATAL VISIT NOTE  Subjective:  [name] [name] is a 25 y.o. G2P1001 at [redacted]w[redacted]d being seen today for ongoing prenatal care.  She is currently monitored for the following issues for this low-risk pregnancy and has Encounter for supervision of normal pregnancy in first trimester; History of C-section; Rubella non-immune status, antepartum; and Obesity in pregnancy on their problem list.  Patient reports no complaints.  Contractions: Not present. Vag. Bleeding: None.   . Denies leaking of fluid.   The following portions of the patient's history were reviewed and updated as appropriate: allergies, current medications, past family history, past medical history, past social history, past surgical history and problem list.   Objective:   Vitals:   12/12/20 1548  BP: 119/67  Pulse: 89  Weight: 255 lb (115.7 kg)    Fetal Status: Fetal Heart Rate (bpm): 150         General:  Alert, oriented and cooperative. Patient is in no acute distress.  Skin: Skin is warm and dry. No rash noted.   Cardiovascular: Normal heart rate noted  Respiratory: Normal respiratory effort, no problems with respiration noted  Abdomen: Soft, gravid, appropriate for gestational age.  Pain/Pressure: Absent     Pelvic: Cervical exam deferred        Extremities: Normal range of motion.     Mental Status: Normal mood and affect. Normal behavior. Normal judgment and thought content.   Assessment and Plan:  Pregnancy: G2P1001 at [redacted]w[redacted]d 1. Encounter for supervision of other normal pregnancy in first trimester  - Genetic testing today. - Discussed importance of starting BASA- RX sent  - [place] MFM OB DETAIL +14 WK; Future - Zofran for nausea, per patient request.  Preterm labor symptoms and general obstetric precautions including but not limited to vaginal bleeding, contractions, leaking of fluid and fetal movement were reviewed in detail with the patient. Please refer to After Visit Summary for other counseling  recommendations.   Return in about 4 weeks (around 01/09/2021), or Next visit with MD to discuss TOLAC.  Future Appointments  Date Time Provider Department Center  01/09/2021  4:00 PM Constant, Peggy, MD [facility] None  01/15/2021  2:30 PM [facility] NURSE [facility]  01/15/2021  2:45 PM [facility] US4 [facility]    Venia Carbon, NP

## 2020-12-17 ENCOUNTER — Encounter: Payer: Self-pay | Admitting: Obstetrics and Gynecology

## 2020-12-23 ENCOUNTER — Encounter: Payer: Self-pay | Admitting: Obstetrics and Gynecology

## 2020-12-31 ENCOUNTER — Other Ambulatory Visit: Payer: Self-pay

## 2020-12-31 DIAGNOSIS — R110 Nausea: Secondary | ICD-10-CM

## 2020-12-31 MED ORDER — ONDANSETRON HCL 4 MG PO TABS: 4.0000 mg | ORAL_TABLET | Freq: Three times a day (TID) | ORAL | 0 refills | Status: DC | PRN

## 2020-12-31 NOTE — Progress Notes (Signed)
Ok to send Rx (verbal) per provider Pt made aware  Rx sent

## 2021-01-09 ENCOUNTER — Ambulatory Visit (INDEPENDENT_AMBULATORY_CARE_PROVIDER_SITE_OTHER): Payer: BLUE CROSS/BLUE SHIELD | Admitting: Obstetrics and Gynecology

## 2021-01-09 ENCOUNTER — Encounter: Payer: Self-pay | Admitting: Obstetrics and Gynecology

## 2021-01-09 ENCOUNTER — Other Ambulatory Visit: Payer: Self-pay

## 2021-01-09 VITALS — BP 109/76 | HR 84 | Wt 262.0 lb

## 2021-01-09 DIAGNOSIS — Z283 Underimmunization status: Secondary | ICD-10-CM

## 2021-01-09 DIAGNOSIS — O99210 Obesity complicating pregnancy, unspecified trimester: Secondary | ICD-10-CM

## 2021-01-09 DIAGNOSIS — O99891 Other specified diseases and conditions complicating pregnancy: Secondary | ICD-10-CM

## 2021-01-09 DIAGNOSIS — Z3481 Encounter for supervision of other normal pregnancy, first trimester: Secondary | ICD-10-CM

## 2021-01-09 DIAGNOSIS — O09899 Supervision of other high risk pregnancies, unspecified trimester: Secondary | ICD-10-CM

## 2021-01-09 NOTE — Patient Instructions (Signed)
Vaginal Birth After Cesarean Delivery  Vaginal birth after cesarean delivery (VBAC) is giving birth vaginally after previously delivering a baby through a cesarean section (C-section). A VBAC may be a safe option for you, depending on your health and other factors. It is important to discuss VBAC with your health care provider early in your pregnancy so you can understand the risks, benefits, and options. Having these discussions early will give you time to make your birth plan. Who are the best candidates for VBAC? The best candidates for VBAC are women who:  Have had one or two prior cesarean deliveries, and the incision made during the delivery was horizontal (low transverse).  Do not have a vertical (classical) scar on their uterus.  Have not had a tear in the wall of their uterus (uterine rupture).  Plan to have more pregnancies. A VBAC is also more likely to be successful:  In women who have previously given birth vaginally.  When labor starts by itself (spontaneously) before the due date. What are the benefits of VBAC? The benefits of delivering your baby vaginally instead of by a cesarean delivery include:  A shorter hospital stay.  A faster recovery time.  Less pain.  Avoiding risks associated with major surgery, such as infection and blood clots.  Less blood loss and less need for donated blood (transfusions). What are the risks of VBAC? The main risk of attempting a VBAC is that it may fail, forcing your health care provider to deliver your baby by a C-section. Other risks are rare and include:  Tearing (rupture) of the scar from a past cesarean delivery.  Other risks associated with vaginal deliveries. If a repeat cesarean delivery is needed, the risks include:  Blood loss.  Infection.  Blood clot.  Damage to surrounding organs.  Removal of the uterus (hysterectomy), if it is damaged.  Placenta problems in future pregnancies. What else should I know  about my options? Delivering a baby through a VBAC is similar to having a normal spontaneous vaginal delivery. Therefore, it is safe:  To try with twins.  For your health care provider to try to turn the baby from a breech position (external cephalic version) during labor.  With epidural analgesia for pain relief. Consider where you would like to deliver your baby. VBAC should be attempted in facilities where an emergency cesarean delivery can be performed. VBAC is not recommended for home births. Any changes in your health or your baby's health during your pregnancy may make it necessary to change your initial decision about VBAC. Your health care provider may recommend that you do not attempt a VBAC if:  Your baby's suspected weight is 8.8 lb (4 kg) or more.  You have preeclampsia. This is a condition that causes high blood pressure along with other symptoms, such as swelling and headaches.  You will have VBAC less than 19 months after your cesarean delivery.  You are past your due date.  You need to have labor started (induced) because your cervix is not ready for labor (unfavorable). Where to find more information  American Pregnancy Association: americanpregnancy.org  American Congress of Obstetricians and Gynecologists: acog.org Summary  Vaginal birth after cesarean delivery (VBAC) is giving birth vaginally after previously delivering a baby through a cesarean section (C-section). A VBAC may be a safe option for you, depending on your health and other factors.  Discuss VBAC with your health care provider early in your pregnancy so you can understand the risks, benefits, options, and   have plenty of time to make your birth plan.  The main risk of attempting a VBAC is that it may fail, forcing your health care provider to deliver your baby by a C-section. Other risks are rare. This information is not intended to replace advice given to you by your health care provider. Make sure  you discuss any questions you have with your health care provider. Document Revised: 02/07/2019 Document Reviewed: 01/19/2017 Elsevier Patient Education  2021 Elsevier Inc.  

## 2021-01-09 NOTE — Progress Notes (Signed)
ROB wants to discuss VBAC

## 2021-01-09 NOTE — Progress Notes (Signed)
   PRENATAL VISIT NOTE  Subjective:  [name] [name] is a 25 y.o. G2P1001 at [redacted]w[redacted]d being seen today for ongoing prenatal care.  She is currently monitored for the following issues for this low-risk pregnancy and has Encounter for supervision of normal pregnancy in first trimester; History of C-section; Rubella non-immune status, antepartum; and Obesity in pregnancy on their problem list.  Patient reports no complaints.  Contractions: Not present. Vag. Bleeding: None.   . Denies leaking of fluid.   The following portions of the patient's history were reviewed and updated as appropriate: allergies, current medications, past family history, past medical history, past social history, past surgical history and problem list.   Objective:   Vitals:   01/09/21 1601  BP: 109/76  Pulse: 84  Weight: 262 lb (118.8 kg)    Fetal Status: Fetal Heart Rate (bpm): 140         General:  Alert, oriented and cooperative. Patient is in no acute distress.  Skin: Skin is warm and dry. No rash noted.   Cardiovascular: Normal heart rate noted  Respiratory: Normal respiratory effort, no problems with respiration noted  Abdomen: Soft, gravid, appropriate for gestational age.  Pain/Pressure: Present     Pelvic: Cervical exam deferred        Extremities: Normal range of motion.  Edema: None  Mental Status: Normal mood and affect. Normal behavior. Normal judgment and thought content.   Assessment and Plan:  Pregnancy: G2P1001 at [redacted]w[redacted]d 1. Encounter for supervision of other normal pregnancy in first trimester Patient is doing well without complaints Anatomy ultrasound 3/23 AFP today Information TOLAC  2. Obesity in pregnancy Patient is not taking ASA  3. Rubella non-immune status, antepartum Will offer pp  Preterm labor symptoms and general obstetric precautions including but not limited to vaginal bleeding, contractions, leaking of fluid and fetal movement were reviewed in detail with the  patient. Please refer to After Visit Summary for other counseling recommendations.   Return in about 4 weeks (around 02/06/2021) for in person, ROB, Low risk.  Future Appointments  Date Time Provider Department Center  01/15/2021  2:30 PM [facility] NURSE [facility]  01/15/2021  2:45 PM [facility] US4 [facility]    Catalina Antigua, MD

## 2021-01-11 LAB — AFP, SERUM, OPEN SPINA BIFIDA
AFP MoM: 1.97
AFP Value: 70.6 ng/mL
Gest. Age on Collection Date: 18.1 weeks
Maternal Age At EDD: 27.3 yr
OSBR Risk 1 IN: 1723
Test Results:: NEGATIVE
Weight: 255 [lb_av]

## 2021-01-15 ENCOUNTER — Ambulatory Visit: Payer: BLUE CROSS/BLUE SHIELD | Admitting: *Deleted

## 2021-01-15 ENCOUNTER — Other Ambulatory Visit: Payer: Self-pay

## 2021-01-15 ENCOUNTER — Encounter: Payer: Self-pay | Admitting: Obstetrics and Gynecology

## 2021-01-15 ENCOUNTER — Encounter: Payer: Self-pay | Admitting: *Deleted

## 2021-01-15 ENCOUNTER — Ambulatory Visit: Payer: BLUE CROSS/BLUE SHIELD | Attending: Obstetrics and Gynecology

## 2021-01-15 VITALS — BP 127/73 | HR 91

## 2021-01-15 DIAGNOSIS — Z3481 Encounter for supervision of other normal pregnancy, first trimester: Secondary | ICD-10-CM | POA: Diagnosis not present

## 2021-01-15 DIAGNOSIS — Z3689 Encounter for other specified antenatal screening: Secondary | ICD-10-CM | POA: Diagnosis present

## 2021-01-15 DIAGNOSIS — Q210 Ventricular septal defect: Secondary | ICD-10-CM | POA: Insufficient documentation

## 2021-01-15 NOTE — Progress Notes (Signed)
C/o" pressure in vagina."

## 2021-01-16 ENCOUNTER — Other Ambulatory Visit: Payer: Self-pay | Admitting: *Deleted

## 2021-01-16 DIAGNOSIS — Z6841 Body Mass Index (BMI) 40.0 and over, adult: Secondary | ICD-10-CM

## 2021-02-06 ENCOUNTER — Encounter: Payer: BLUE CROSS/BLUE SHIELD | Admitting: Advanced Practice Midwife

## 2021-02-13 ENCOUNTER — Ambulatory Visit: Payer: BLUE CROSS/BLUE SHIELD

## 2021-02-13 ENCOUNTER — Ambulatory Visit: Payer: BLUE CROSS/BLUE SHIELD | Attending: Maternal & Fetal Medicine

## 2021-06-12 ENCOUNTER — Other Ambulatory Visit: Payer: Self-pay

## 2021-06-12 ENCOUNTER — Encounter (HOSPITAL_COMMUNITY): Payer: Self-pay | Admitting: Emergency Medicine

## 2021-06-12 ENCOUNTER — Emergency Department (HOSPITAL_COMMUNITY)
Admission: EM | Admit: 2021-06-12 | Discharge: 2021-06-13 | Disposition: A | Discharge status: Home / Self Care | Payer: BLUE CROSS/BLUE SHIELD | Source: Home / Self Care

## 2021-06-12 DIAGNOSIS — R519 Headache, unspecified: Secondary | ICD-10-CM | POA: Insufficient documentation

## 2021-06-12 DIAGNOSIS — Z5321 Procedure and treatment not carried out due to patient leaving prior to being seen by health care provider: Secondary | ICD-10-CM | POA: Insufficient documentation

## 2021-06-12 DIAGNOSIS — M542 Cervicalgia: Secondary | ICD-10-CM | POA: Insufficient documentation

## 2021-06-12 DIAGNOSIS — J189 Pneumonia, unspecified organism: Secondary | ICD-10-CM | POA: Diagnosis not present

## 2021-06-12 LAB — CBC WITH DIFFERENTIAL/PLATELET
Abs Immature Granulocytes: 0.12 10*3/uL — ABNORMAL HIGH (ref 0.00–0.07)
Basophils Absolute: 0 10*3/uL (ref 0.0–0.1)
Basophils Relative: 0 %
Eosinophils Absolute: 0.2 10*3/uL (ref 0.0–0.5)
Eosinophils Relative: 2 %
HCT: 27.9 % — ABNORMAL LOW (ref 36.0–46.0)
Hemoglobin: 8.7 g/dL — ABNORMAL LOW (ref 12.0–15.0)
Immature Granulocytes: 1 %
Lymphocytes Relative: 16 %
Lymphs Abs: 1.3 10*3/uL (ref 0.7–4.0)
MCH: 26.9 pg (ref 26.0–34.0)
MCHC: 31.2 g/dL (ref 30.0–36.0)
MCV: 86.4 fL (ref 80.0–100.0)
Monocytes Absolute: 0.6 10*3/uL (ref 0.1–1.0)
Monocytes Relative: 6 %
Neutro Abs: 6.4 10*3/uL (ref 1.7–7.7)
Neutrophils Relative %: 75 %
Platelets: 346 10*3/uL (ref 150–400)
RBC: 3.23 MIL/uL — ABNORMAL LOW (ref 3.87–5.11)
RDW: 15.2 % (ref 11.5–15.5)
WBC: 8.6 10*3/uL (ref 4.0–10.5)
nRBC: 0 % (ref 0.0–0.2)

## 2021-06-12 LAB — BASIC METABOLIC PANEL
Anion gap: 9 (ref 5–15)
BUN: 11 mg/dL (ref 6–20)
CO2: 23 mmol/L (ref 22–32)
Calcium: 8.5 mg/dL — ABNORMAL LOW (ref 8.9–10.3)
Chloride: 106 mmol/L (ref 98–111)
Creatinine, Ser: 0.69 mg/dL (ref 0.44–1.00)
GFR, Estimated: >60 mL/min (ref >60)
Glucose, Bld: 79 mg/dL (ref 70–99)
Potassium: 3.6 mmol/L (ref 3.5–5.1)
Sodium: 138 mmol/L (ref 135–145)

## 2021-06-12 NOTE — ED Notes (Signed)
Pt did not respond when called  For vitals check X2

## 2021-06-12 NOTE — ED Triage Notes (Signed)
Patient with neck stiffness and migraine.  Patient has had this since this morning.  She has more pressure in her head and neck when she sits up.  She did have a csection on Sunday.  Patient able to move head up and down, left to right with some pain.

## 2021-06-12 NOTE — ED Provider Notes (Signed)
Emergency Medicine Provider Triage Evaluation Note  [name] [name] , a 25 y.o. female  was evaluated in triage.  Pt complains of neck stiffness and headache.  Headache has been constant throughout the day.  Patient reports she has more pressure on her.  Denies any recent falls or injuries.  Denies any numbness, weakness, visual disturbance.    Patient reports that she had C-section on Sunday.  Denies any abdominal pain  Review of Systems  Positive: Headache, neck stiffness, chills Negative: Fever, abdominal pain, nausea, vomiting, numbness, weakness, visual disturbance, URI symptoms, cough  Physical Exam  LMP 09/04/2020  Gen:   Awake, no distress   Resp:  Normal effort  MSK:   Moves extremities without difficulty  Other:  No midline tenderness or deformity to cervical spine.  Neck is supple, patient has full range of motion.  Abdomen soft, nondistended, mild tenderness near incision site.  Incision site is covered with bandage, bandages not soiled, no erythema noted beyond bandage.  Medical Decision Making  Medically screening exam initiated at 8:48 PM.  Appropriate orders placed.  [name] [name] was informed that the remainder of the evaluation will be completed by another provider, this initial triage assessment does not replace that evaluation, and the importance of remaining in the ED until their evaluation is complete.  The patient appears stable so that the remainder of the work up may be completed by another provider.      Haskel Schroeder, PA-C 06/12/21 2052    Franne Forts, DO 06/13/21 0023

## 2021-06-13 ENCOUNTER — Emergency Department (HOSPITAL_BASED_OUTPATIENT_CLINIC_OR_DEPARTMENT_OTHER): Payer: BLUE CROSS/BLUE SHIELD

## 2021-06-13 ENCOUNTER — Inpatient Hospital Stay (HOSPITAL_COMMUNITY): Payer: BLUE CROSS/BLUE SHIELD

## 2021-06-13 ENCOUNTER — Other Ambulatory Visit: Payer: Self-pay

## 2021-06-13 ENCOUNTER — Encounter (HOSPITAL_BASED_OUTPATIENT_CLINIC_OR_DEPARTMENT_OTHER): Payer: Self-pay | Admitting: Emergency Medicine

## 2021-06-13 ENCOUNTER — Inpatient Hospital Stay (HOSPITAL_BASED_OUTPATIENT_CLINIC_OR_DEPARTMENT_OTHER)
Admission: EM | Admit: 2021-06-13 | Discharge: 2021-06-15 | DRG: 776 | Disposition: A | Discharge status: Home / Self Care | Payer: BLUE CROSS/BLUE SHIELD | Attending: Internal Medicine | Admitting: Internal Medicine

## 2021-06-13 DIAGNOSIS — K219 Gastro-esophageal reflux disease without esophagitis: Secondary | ICD-10-CM | POA: Diagnosis present

## 2021-06-13 DIAGNOSIS — Z7982 Long term (current) use of aspirin: Secondary | ICD-10-CM | POA: Diagnosis not present

## 2021-06-13 DIAGNOSIS — Z79899 Other long term (current) drug therapy: Secondary | ICD-10-CM | POA: Diagnosis not present

## 2021-06-13 DIAGNOSIS — Z8249 Family history of ischemic heart disease and other diseases of the circulatory system: Secondary | ICD-10-CM

## 2021-06-13 DIAGNOSIS — Z91030 Bee allergy status: Secondary | ICD-10-CM

## 2021-06-13 DIAGNOSIS — I5031 Acute diastolic (congestive) heart failure: Secondary | ICD-10-CM | POA: Diagnosis not present

## 2021-06-13 DIAGNOSIS — Z6841 Body Mass Index (BMI) 40.0 and over, adult: Secondary | ICD-10-CM | POA: Diagnosis not present

## 2021-06-13 DIAGNOSIS — J9691 Respiratory failure, unspecified with hypoxia: Secondary | ICD-10-CM | POA: Diagnosis present

## 2021-06-13 DIAGNOSIS — F17210 Nicotine dependence, cigarettes, uncomplicated: Secondary | ICD-10-CM | POA: Diagnosis present

## 2021-06-13 DIAGNOSIS — J189 Pneumonia, unspecified organism: Secondary | ICD-10-CM | POA: Diagnosis present

## 2021-06-13 DIAGNOSIS — I509 Heart failure, unspecified: Secondary | ICD-10-CM

## 2021-06-13 DIAGNOSIS — D649 Anemia, unspecified: Secondary | ICD-10-CM

## 2021-06-13 DIAGNOSIS — Z91013 Allergy to seafood: Secondary | ICD-10-CM | POA: Diagnosis not present

## 2021-06-13 DIAGNOSIS — J9601 Acute respiratory failure with hypoxia: Secondary | ICD-10-CM | POA: Diagnosis not present

## 2021-06-13 DIAGNOSIS — Z833 Family history of diabetes mellitus: Secondary | ICD-10-CM

## 2021-06-13 DIAGNOSIS — O9953 Diseases of the respiratory system complicating the puerperium: Principal | ICD-10-CM | POA: Diagnosis present

## 2021-06-13 DIAGNOSIS — O99215 Obesity complicating the puerperium: Secondary | ICD-10-CM | POA: Diagnosis present

## 2021-06-13 DIAGNOSIS — O9963 Diseases of the digestive system complicating the puerperium: Secondary | ICD-10-CM | POA: Diagnosis present

## 2021-06-13 DIAGNOSIS — O99335 Smoking (tobacco) complicating the puerperium: Secondary | ICD-10-CM | POA: Diagnosis present

## 2021-06-13 DIAGNOSIS — Z20822 Contact with and (suspected) exposure to covid-19: Secondary | ICD-10-CM | POA: Diagnosis present

## 2021-06-13 DIAGNOSIS — O9081 Anemia of the puerperium: Secondary | ICD-10-CM | POA: Diagnosis present

## 2021-06-13 LAB — CBC WITH DIFFERENTIAL/PLATELET
Abs Immature Granulocytes: 0.11 10*3/uL — ABNORMAL HIGH (ref 0.00–0.07)
Basophils Absolute: 0 10*3/uL (ref 0.0–0.1)
Basophils Relative: 0 %
Eosinophils Absolute: 0.2 10*3/uL (ref 0.0–0.5)
Eosinophils Relative: 2 %
HCT: 30.1 % — ABNORMAL LOW (ref 36.0–46.0)
Hemoglobin: 9.4 g/dL — ABNORMAL LOW (ref 12.0–15.0)
Immature Granulocytes: 1 %
Lymphocytes Relative: 17 %
Lymphs Abs: 1.5 10*3/uL (ref 0.7–4.0)
MCH: 26.3 pg (ref 26.0–34.0)
MCHC: 31.2 g/dL (ref 30.0–36.0)
MCV: 84.3 fL (ref 80.0–100.0)
Monocytes Absolute: 0.6 10*3/uL (ref 0.1–1.0)
Monocytes Relative: 6 %
Neutro Abs: 6.3 10*3/uL (ref 1.7–7.7)
Neutrophils Relative %: 74 %
Platelets: 379 10*3/uL (ref 150–400)
RBC: 3.57 MIL/uL — ABNORMAL LOW (ref 3.87–5.11)
RDW: 15.5 % (ref 11.5–15.5)
WBC: 8.7 10*3/uL (ref 4.0–10.5)
nRBC: 0 % (ref 0.0–0.2)

## 2021-06-13 LAB — HEPATIC FUNCTION PANEL
ALT: 17 U/L (ref 0–44)
AST: 16 U/L (ref 15–41)
Albumin: 3.4 g/dL — ABNORMAL LOW (ref 3.5–5.0)
Alkaline Phosphatase: 116 U/L (ref 38–126)
Bilirubin, Direct: 0.1 mg/dL (ref 0.0–0.2)
Indirect Bilirubin: 0.5 mg/dL (ref 0.3–0.9)
Total Bilirubin: 0.6 mg/dL (ref 0.3–1.2)
Total Protein: 6.6 g/dL (ref 6.5–8.1)

## 2021-06-13 LAB — BASIC METABOLIC PANEL
Anion gap: 9 (ref 5–15)
BUN: 11 mg/dL (ref 6–20)
CO2: 25 mmol/L (ref 22–32)
Calcium: 8.9 mg/dL (ref 8.9–10.3)
Chloride: 107 mmol/L (ref 98–111)
Creatinine, Ser: 0.75 mg/dL (ref 0.44–1.00)
GFR, Estimated: >60 mL/min (ref >60)
Glucose, Bld: 73 mg/dL (ref 70–99)
Potassium: 3.8 mmol/L (ref 3.5–5.1)
Sodium: 141 mmol/L (ref 135–145)

## 2021-06-13 LAB — RESP PANEL BY RT-PCR (FLU A&B, COVID) ARPGX2
Influenza A by PCR: NEGATIVE
Influenza B by PCR: NEGATIVE
SARS Coronavirus 2 by RT PCR: NEGATIVE

## 2021-06-13 LAB — PROTIME-INR
INR: 1 (ref 0.8–1.2)
Prothrombin Time: 13 seconds (ref 11.4–15.2)

## 2021-06-13 LAB — BRAIN NATRIURETIC PEPTIDE: B Natriuretic Peptide: 220.2 pg/mL — ABNORMAL HIGH (ref 0.0–100.0)

## 2021-06-13 LAB — TSH: TSH: 3.276 u[IU]/mL (ref 0.350–4.500)

## 2021-06-13 LAB — LACTIC ACID, PLASMA: Lactic Acid, Venous: 0.5 mmol/L (ref 0.5–1.9)

## 2021-06-13 LAB — PROCALCITONIN: Procalcitonin: <0.1 ng/mL

## 2021-06-13 MED ORDER — POTASSIUM CHLORIDE CRYS ER 20 MEQ PO TBCR
40.0000 meq | EXTENDED_RELEASE_TABLET | ORAL | Status: AC
  Administered 2021-06-13: 40 meq via ORAL
  Filled 2021-06-13: qty 2

## 2021-06-13 MED ORDER — SODIUM CHLORIDE 0.9% FLUSH
3.0000 mL | Freq: Two times a day (BID) | INTRAVENOUS | Status: DC
  Administered 2021-06-14 – 2021-06-15 (×4): 3 mL via INTRAVENOUS

## 2021-06-13 MED ORDER — ONDANSETRON HCL 4 MG/2ML IJ SOLN: 4.0000 mg | Freq: Four times a day (QID) | INTRAMUSCULAR | Status: DC | PRN

## 2021-06-13 MED ORDER — SODIUM CHLORIDE 0.9% FLUSH: 3.0000 mL | INTRAVENOUS | Status: DC | PRN

## 2021-06-13 MED ORDER — ENOXAPARIN SODIUM 40 MG/0.4ML IJ SOSY
40.0000 mg | PREFILLED_SYRINGE | INTRAMUSCULAR | Status: DC
  Administered 2021-06-13 – 2021-06-14 (×2): 40 mg via SUBCUTANEOUS
  Filled 2021-06-13 (×2): qty 0.4

## 2021-06-13 MED ORDER — FUROSEMIDE 10 MG/ML IJ SOLN
40.0000 mg | Freq: Two times a day (BID) | INTRAMUSCULAR | Status: DC
  Administered 2021-06-13 – 2021-06-15 (×4): 40 mg via INTRAVENOUS
  Filled 2021-06-13 (×4): qty 4

## 2021-06-13 MED ORDER — IOHEXOL 350 MG/ML SOLN
80.0000 mL | Freq: Once | INTRAVENOUS | Status: AC | PRN
  Administered 2021-06-13: 80 mL via INTRAVENOUS

## 2021-06-13 MED ORDER — SODIUM CHLORIDE 0.9 % IV SOLN: 250.0000 mL | INTRAVENOUS | Status: DC | PRN

## 2021-06-13 MED ORDER — FUROSEMIDE 10 MG/ML IJ SOLN
20.0000 mg | Freq: Once | INTRAMUSCULAR | Status: AC
  Administered 2021-06-13: 20 mg via INTRAVENOUS
  Filled 2021-06-13: qty 2

## 2021-06-13 MED ORDER — ACETAMINOPHEN 325 MG PO TABS
650.0000 mg | ORAL_TABLET | ORAL | Status: DC | PRN
  Administered 2021-06-14 (×2): 650 mg via ORAL
  Filled 2021-06-13 (×2): qty 2

## 2021-06-13 MED ORDER — SODIUM CHLORIDE 0.9 % IV SOLN
2.0000 g | Freq: Once | INTRAVENOUS | Status: AC
  Administered 2021-06-13: 2 g via INTRAVENOUS
  Filled 2021-06-13: qty 20

## 2021-06-13 MED ORDER — SODIUM CHLORIDE 0.9 % IV SOLN
500.0000 mg | INTRAVENOUS | Status: DC
  Administered 2021-06-13 – 2021-06-14 (×2): 500 mg via INTRAVENOUS
  Filled 2021-06-13 (×2): qty 500

## 2021-06-13 NOTE — Progress Notes (Signed)
Pt arrived to the unit, became very tearful MD made aware pt has arrived.

## 2021-06-13 NOTE — ED Notes (Signed)
Called pt x4 for vital recheck. No response.

## 2021-06-13 NOTE — ED Notes (Signed)
Patient transported to CT 

## 2021-06-13 NOTE — Progress Notes (Signed)
  Echocardiogram 2D Echocardiogram has been performed.  Delcie Roch 06/13/2021, 5:40 PM

## 2021-06-13 NOTE — ED Triage Notes (Addendum)
States yesterday am woke up with H/A and SOB. Went to [facility] ED last night for same but left due to wait time . 8 days postpartum, C/S

## 2021-06-13 NOTE — ED Provider Notes (Signed)
[place] EMERGENCY DEPT Provider Note   CSN: [id number] Arrival date & time: 06/13/21  1914     History Chief Complaint  Patient presents with   Shortness of Breath    Headache    [name] [name] is a 25 y.o. female.  Pt presents to the ED today with sob, headache, malaise.  Pt initially went to the ED yesterday at [facility], but left due to the long wait.  Pt had a repeat c-section at [facility] on 8/11.  She denies any complications during delivery and was d/c on 8/13.  She did have some mildly elevated htn while pregnant, but was not put on any bp meds. Pt is currently bottle feeding.       Past Medical History:  Diagnosis Date   Gall bladder inflammation    GERD (gastroesophageal reflux disease)    Medical history non-contributory    Migraines     Patient Active Problem List   Diagnosis Date Noted   VSD (ventricular septal defect) 01/15/2021   Rubella non-immune status, antepartum 11/16/2020   Obesity in pregnancy 11/16/2020   History of C-section 11/14/2020   Encounter for supervision of normal pregnancy in first trimester 10/14/2020    Past Surgical History:  Procedure Laterality Date   CESAREAN SECTION N/A 09/18/2015   Procedure: CESAREAN SECTION;  Surgeon: Molli Posey, MD;  Location: [facility];  Service: Obstetrics;  Laterality: N/A;   CESAREAN SECTION  06/05/2021   MOUTH SURGERY     NO PAST SURGERIES       OB History     Gravida  2   Para  1   Term  1   Preterm      AB      Living  1      SAB      IAB      Ectopic      Multiple  0   Live Births  1           Family History  Problem Relation Age of Onset   Diabetes Maternal Uncle    Hypertension Maternal Uncle    Heart disease Maternal Uncle     Social History   Tobacco Use   Smoking status: Light Smoker    Types: Cigarettes, Cigars    Last attempt to quit: 01/14/2015    Years since quitting: 6.4   Smokeless tobacco: Never   Tobacco comments:    stopped after  confirmed pregnancy  Vaping Use   Vaping Use: Never used  Substance Use Topics   Alcohol use: Not Currently    Comment: occ   Drug use: No    Home Medications Prior to Admission medications   Medication Sig Start Date End Date Taking? Authorizing Provider  acetaminophen (TYLENOL) 325 MG tablet Take 650 mg by mouth every 6 (six) hours as needed.    [provider]  aspirin 81 MG chewable tablet Chew 1 tablet (81 mg total) by mouth daily. Patient not taking: Reported on 01/15/2021 12/12/20   Rasch, Anderson Malta I, NP  aspirin-acetaminophen-caffeine (EXCEDRIN MIGRAINE) [phone] MG tablet Take 1 tablet by mouth every 6 (six) hours as needed for headache. Patient not taking: No sig reported    [provider]  Blood Pressure Monitoring (BLOOD PRESSURE KIT) DEVI 1 kit by Does not apply route once a week. 10/14/20   Constant, Peggy, MD  cetirizine (ZYRTEC ALLERGY) 10 MG tablet Take 1 tablet (10 mg total) by mouth daily. Patient not taking: No sig  reported 03/02/20   Darr, Edison Nasuti, PA-C  Cyanocobalamin (B-12 PO) Take 1 tablet by mouth daily. Patient not taking: Reported on 12/12/2020    [provider]  Doxylamine-Pyridoxine (DICLEGIS) 10-10 MG TBEC Take 2 tablets by mouth at bedtime. If symptoms persist, add one tablet in the morning and one in the afternoon Patient not taking: Reported on 12/12/2020 11/07/20   Constant, Peggy, MD  fluticasone (FLONASE) 50 MCG/ACT nasal spray Place 1 spray into both nostrils daily. Patient not taking: Reported on 12/12/2020 03/02/20   Darr, Edison Nasuti, PA-C  ibuprofen (ADVIL) 600 MG tablet Take 600 mg by mouth every 6 (six) hours as needed. 06/07/21   [provider]  ondansetron (ZOFRAN) 4 MG tablet Take 1 tablet (4 mg total) by mouth every 8 (eight) hours as needed for nausea or vomiting. 12/31/20   Rasch, Artist Pais, NP  Prenatal Vit-Fe Fumarate-FA (PRENATAL MULTIVITAMIN) TABS tablet Take 1 tablet by mouth daily at 12 noon.    [provider]    Allergies    Bee venom and Shellfish allergy  Review of Systems   Review of Systems  Constitutional:  Positive for fatigue.  Respiratory:  Positive for cough and shortness of breath.   All other systems reviewed and are negative.  Physical Exam Updated Vital Signs BP (!) 115/92 (BP Location: Right Arm)   Pulse 90   Temp 98.7 F (37.1 C) (Oral)   Resp (!) 27   Ht 5' 1"  (1.549 m)   Wt 135.9 kg   LMP 09/04/2020   SpO2 95%   Breastfeeding No   BMI 56.59 kg/m   Physical Exam Vitals and nursing note reviewed.  Constitutional:      Appearance: She is well-developed. She is obese.  HENT:     Head: Normocephalic and atraumatic.     Mouth/Throat:     Mouth: Mucous membranes are moist.     Pharynx: Oropharynx is clear.  Eyes:     Extraocular Movements: Extraocular movements intact.     Pupils: Pupils are equal, round, and reactive to light.  Cardiovascular:     Rate and Rhythm: Normal rate and regular rhythm.  Pulmonary:     Effort: Pulmonary effort is normal.     Breath sounds: Rhonchi present.  Abdominal:     General: Bowel sounds are normal.     Palpations: Abdomen is soft.  Musculoskeletal:        General: Normal range of motion.     Cervical back: Normal range of motion and neck supple.  Skin:    General: Skin is warm.     Capillary Refill: Capillary refill takes less than 2 seconds.  Neurological:     General: No focal deficit present.     Mental Status: She is alert and oriented to person, place, and time.  Psychiatric:        Mood and Affect: Mood normal.        Behavior: Behavior normal.    ED Results / Procedures / Treatments   Labs (all labs ordered are listed, but only abnormal results are displayed) Labs Reviewed  CBC WITH DIFFERENTIAL/PLATELET - Abnormal; Notable for the following components:      Result Value   RBC 3.57 (*)    Hemoglobin 9.4 (*)    HCT 30.1 (*)    Abs Immature Granulocytes 0.11 (*)    All other components  within normal limits  BRAIN NATRIURETIC PEPTIDE - Abnormal; Notable for the following components:   B  Natriuretic Peptide 220.2 (*)    All other components within normal limits  HEPATIC FUNCTION PANEL - Abnormal; Notable for the following components:   Albumin 3.4 (*)    All other components within normal limits  RESP PANEL BY RT-PCR (FLU A&B, COVID) ARPGX2  CULTURE, BLOOD (ROUTINE X 2)  CULTURE, BLOOD (ROUTINE X 2)  BASIC METABOLIC PANEL  PROTIME-INR  LACTIC ACID, PLASMA  LACTIC ACID, PLASMA  RECENT LABS: Results for orders placed or performed during the hospital encounter of 06/05/21  Comprehensive Metabolic Panel  Result Value Ref Range  Sodium 136 135 - 146 MMOL/L  Potassium 4.5 3.5 - 5.3 MMOL/L  Chloride 107 98 - 110 MMOL/L  CO2 22 (L) 23 - 30 MMOL/L  BUN 9 8 - 24 MG/DL  Glucose 84 70 - 99 MG/DL  Creatinine 0.61 0.50 - 1.50 MG/DL  Calcium 8.8 8.5 - 10.5 MG/DL  Total Protein 6.4 6.0 - 8.3 G/DL  Albumin 3.5 3.5 - 5.0 G/DL  Total Bilirubin 0.7 0.1 - 1.2 MG/DL  Alkaline Phosphatase 168 (H) 25 - 125 IU/L or U/L  AST (SGOT) 13 5 - 40 IU/L or U/L  ALT (SGPT) 10 5 - 50 IU/L or U/L  Anion Gap 7 4 - 14 MMOL/L  Est. GFR >90 >=60 ML/MIN/1.73 M*2  RPR (Syphilis Serology)  Result Value Ref Range  RPR Nonreactive Nonreactive  Comprehensive Metabolic Panel  Result Value Ref Range  Sodium 136 135 - 146 MMOL/L  Potassium 4.0 3.5 - 5.3 MMOL/L  Chloride 108 98 - 110 MMOL/L  CO2 23 23 - 30 MMOL/L  BUN 7 (L) 8 - 24 MG/DL  Glucose 119 (H) 70 - 99 MG/DL  Creatinine 0.59 0.50 - 1.50 MG/DL  Calcium 8.6 8.5 - 10.5 MG/DL  Total Protein 5.6 (L) 6.0 - 8.3 G/DL  Albumin 2.8 (L) 3.5 - 5.0 G/DL  Total Bilirubin 0.5 0.1 - 1.2 MG/DL  Alkaline Phosphatase 151 (H) 25 - 125 IU/L or U/L  AST (SGOT) 15 5 - 40 IU/L or U/L  ALT (SGPT) 9 5 - 50 IU/L or U/L  Anion Gap 5 4 - 14 MMOL/L  Est. GFR >90 >=60 ML/MIN/1.73 M*2  Type and Screen  Result Value Ref Range  ABO Group O  Rh Typing POS  Antibody  Screen NEG  CBC and Differential  Result Value Ref Range  WBC 7.7 4.4 - 11.0 x 10*3/uL  RBC 3.84 (L) 4.10 - 5.10 x 10*6/uL  Hemoglobin 10.1 (L) 12.3 - 15.3 G/DL  Hematocrit 31.1 (L) 35.9 - 44.6 %  MCV 81.0 80.0 - 96.0 FL  MCH 26.2 (L) 27.5 - 33.2 PG  MCHC 32.3 (L) 33.0 - 37.0 G/DL  RDW 15.0 12.3 - 17.0 %  Platelets 255 150 - 450 X 10*3/uL  MPV 8.0 6.8 - 10.2 FL  Neutrophil % 73 %  Lymphocyte % 18 %  Monocyte % 8 %  Eosinophil % 1 %  Basophil % 0 %  Neutrophil Absolute 5.6 1.8 - 7.8 x 10*3/uL  Lymphocyte Absolute 1.4 1.0 - 4.8 x 10*3/uL  Monocyte Absolute 0.6 0.0 - 0.8 x 10*3/uL  Eosinophil Absolute 0.1 0.0 - 0.5 x 10*3/uL  Basophil Absolute 0.0 0.0 - 0.2 x 10*3/uL  CBC and Differential  Result Value Ref Range  WBC 8.1 4.4 - 11.0 x 10*3/uL  RBC 3.49 (L) 4.10 - 5.10 x 10*6/uL  Hemoglobin 9.2 (L) 12.3 - 15.3 G/DL  Hematocrit 28.1 (L) 35.9 - 44.6 %  MCV 80.5 80.0 -  96.0 FL  MCH 26.5 (L) 27.5 - 33.2 PG  MCHC 32.9 (L) 33.0 - 37.0 G/DL  RDW 15.1 12.3 - 17.0 %  Platelets 242 150 - 450 X 10*3/uL  MPV 7.9 6.8 - 10.2 FL  Neutrophil % 73 %  Lymphocyte % 17 %  Monocyte % 8 %  Eosinophil % 2 %  Basophil % 0 %  Neutrophil Absolute 5.9 1.8 - 7.8 x 10*3/uL  Lymphocyte Absolute 1.4 1.0 - 4.8 x 10*3/uL  Monocyte Absolute 0.6 0.0 - 0.8 x 10*3/uL  Eosinophil Absolute 0.2 0.0 - 0.5 x 10*3/uL  Basophil Absolute 0.0 0.0 - 0.2 x 10*3/uL   EKG EKG Interpretation  Date/Time:  Friday June 13 2021 09:40:13 EDT Ventricular Rate:  92 PR Interval:  144 QRS Duration: 82 QT Interval:  354 QTC Calculation: 438 R Axis:   82 Text Interpretation: Sinus rhythm No significant change since last tracing Confirmed by Isla Pence [phone]) on 06/13/2021 11:13:24 AM  Radiology CT Angio Chest PE W and/or Wo Contrast  Result Date: 06/13/2021 CLINICAL DATA:  Acute shortness of breath, headache, 8 days postpartum EXAM: CT ANGIOGRAPHY CHEST WITH CONTRAST TECHNIQUE: Multidetector CT imaging of the chest  was performed using the standard protocol during bolus administration of intravenous contrast. Multiplanar CT image reconstructions and MIPs were obtained to evaluate the vascular anatomy. CONTRAST:  75m OMNIPAQUE IOHEXOL 350 MG/ML SOLN COMPARISON:  None. FINDINGS: Cardiovascular: Pulmonary arteries are patent and normal in caliber. Negative for significant acute pulmonary embolus by CTA. Intact thoracic aorta. Negative for aneurysm or dissection. Normal heart size. No pericardial effusion. Central venous structures appear patent.  No veno-occlusive process. Mediastinum/Nodes: Mildly enlarged thyroid with slight substernal extension. Trachea and central airways are patent. Esophagus nondilated. No adenopathy. Residual thymic triangular soft tissue suspected in the anterior mediastinum. Lungs/Pleura: Consolidative patchy peribronchovascular airspace opacities throughout all lobes of both lungs with a more central pattern of distribution. Airspace process worse in the lower lobes and the lung apices. Appearance compatible with extensive bilateral pneumonia or acute alveolar edema. Minor subsegmental bibasilar atelectasis. Symmetric small pleural effusions. No pneumothorax. Upper Abdomen: No acute abnormality. Musculoskeletal: No chest wall abnormality. No acute or significant osseous findings. Review of the MIP images confirms the above findings. IMPRESSION: Negative for significant acute pulmonary embolus. Extensive multilobar patchy consolidative airspace process with a central pattern of distribution favored to be multifocal pneumonia versus acute alveolar edema. Associated bilateral small non loculated pleural effusions. Electronically Signed   By: MJerilynn Mages  Shick M.D.   On: 06/13/2021 10:50   DG Chest Port 1 View  Result Date: 06/13/2021 CLINICAL DATA:  Shortness of breath. EXAM: PORTABLE CHEST 1 VIEW COMPARISON:  None. FINDINGS: The heart size and mediastinal contours are within normal limits. No pneumothorax or  pleural effusion is noted. Mild central pulmonary vascular congestion is noted. Mild bilateral pulmonary edema possibly atypical inflammation may be present. The visualized skeletal structures are unremarkable. IMPRESSION: Mild central pulmonary vascular congestion is noted with possible mild bilateral pulmonary edema or possibly atypical inflammation. Electronically Signed   By: JMarijo ConceptionM.D.   On: 06/13/2021 10:31    Procedures Procedures   Medications Ordered in ED Medications  cefTRIAXone (ROCEPHIN) 2 g in sodium chloride 0.9 % 100 mL IVPB (has no administration in time range)  azithromycin (ZITHROMAX) 500 mg in sodium chloride 0.9 % 250 mL IVPB (has no administration in time range)  furosemide (LASIX) injection 20 mg (has no administration in time range)  iohexol (OMNIPAQUE) 350  MG/ML injection 80 mL (80 mLs Intravenous Contrast Given 06/13/21 1021)    ED Course  I have reviewed the triage vital signs and the nursing notes.  Pertinent labs & imaging results that were available during my care of the patient were reviewed by me and considered in my medical decision making (see chart for details).    MDM Rules/Calculators/A&P                           O2 sat RA 88-89.  Pt placed on 2 L oxygen and O2 sat in the mid-90s.   Covid neg.  PE neg.  CT showed multifocal pna.  Pt given rocephin/zithromax.  Blood cultures pending.  Lasix 20 mg given in case this is CHF.   Pt d/w Dr. Tamala Julian (triad) for admission.  CRITICAL CARE Performed by: Isla Pence   Total critical care time: 30 minutes  Critical care time was exclusive of separately billable procedures and treating other patients.  Critical care was necessary to treat or prevent imminent or life-threatening deterioration.  Critical care was time spent personally by me on the following activities: development of treatment plan with patient and/or surrogate as well as nursing, discussions with consultants, evaluation of  patient's response to treatment, examination of patient, obtaining history from patient or surrogate, ordering and performing treatments and interventions, ordering and review of laboratory studies, ordering and review of radiographic studies, pulse oximetry and re-evaluation of patient's condition.   Final Clinical Impression(s) / ED Diagnoses Final diagnoses:  Acute respiratory failure with hypoxia ([place])  Multifocal pneumonia    Rx / DC Orders ED Discharge Orders     None        Isla Pence, MD 06/13/21 1138

## 2021-06-13 NOTE — H&P (Addendum)
History and Physical    [name] [name] MAU:[id number] DOB: 03/19/1996 DOA: 06/13/2021  Referring MD/NP/PA: Isla Pence, MD PCP: Patient, No Pcp Per (Inactive)  Patient coming from: [place] transfer  Chief Complaint: Shortness of breath  I have personally briefly reviewed patient's old medical records in [place]   HPI: [name] [name] is a 25 y.o. female with medical history significant of morbid obesity and cesarean section with tubal ligation on 8/11 who presents with complaints of shortness of breath.  Symptoms woke her out of sleep yesterday morning.  Associated symptoms included complaints of some wheezing, headache, and neck pain.  She is postop day 8 from cesarean section with spinal block giving birth to a baby boy who is doing well.  She had been well also except for some mild vaginal bleeding.   Denies having any significant fever, cough, chest pain, palpitation, leg swelling, abdominal pain, nausea, vomiting, changes in vision, focal weakness, diarrhea, or recent fall/trauma.  She denies any history of heart disease or asthma to her knowledge.  Her maternal grandmother died of heart disease at the age of 65, and her maternal uncle died at the age of 35 with heart disease who also had a pacemaker.  Patient admitted to smoking tobacco prior to finding out she was pregnant, but has not smoked since then.  She initially came to the emergency department yesterday, but left due to long wait time.  She went to [facility] after symptoms persisted  ED Course: Upon admission into the emergency department patient was seen to be afebrile, respirations 10-30, blood pressures elevated up to 164/105, and O2 saturations noted to be as low as 88% with improvement on 2 L of nasal cannula oxygen to greater than 92%.  Labs significant for hemoglobin 9.4, lactic acid 0.5, and BNP 220.2.  Chest x-ray significant for mild central pulmonary vascular congestion with mild bilateral  pulmonary edema or possible atypical inflammation.  Due to recent surgery a CTA was obtained, but did not show any signs of acute PE.  It did note extensive patchy consolidative airspace process  favored to be multifocal pneumonia versus acute alveolar edema and bilateral nonloculated pleural effusions.  Patient has been started on Rocephin and azithromycin.  Patient denies any significant plaints of headache at this time.  Review of Systems  Constitutional:  Negative for fever and malaise/fatigue.  HENT:  Negative for ear discharge and nosebleeds.   Eyes:  Negative for double vision and photophobia.  Respiratory:  Positive for cough and shortness of breath.   Cardiovascular:  Negative for chest pain and leg swelling.  Gastrointestinal:  Negative for nausea and vomiting.  Genitourinary:  Negative for dysuria and hematuria.  Musculoskeletal:  Negative for falls and myalgias.  Skin:  Negative for rash.  Neurological:  Negative for focal weakness and loss of consciousness.  Psychiatric/Behavioral:  Negative for substance abuse.    Past Medical History:  Diagnosis Date   Gall bladder inflammation    GERD (gastroesophageal reflux disease)    Medical history non-contributory    Migraines     Past Surgical History:  Procedure Laterality Date   CESAREAN SECTION N/A 09/18/2015   Procedure: CESAREAN SECTION;  Surgeon: Molli Posey, MD;  Location: [facility];  Service: Obstetrics;  Laterality: N/A;   CESAREAN SECTION  06/05/2021   MOUTH SURGERY     NO PAST SURGERIES       reports that she has been smoking cigarettes and cigars. She has never used  smokeless tobacco. She reports that she does not currently use alcohol. She reports that she does not use drugs.  Allergies  Allergen Reactions   Bee Venom    Shellfish Allergy Hives    Family History  Problem Relation Age of Onset   Diabetes Maternal Uncle    Hypertension Maternal Uncle    Heart disease Maternal Uncle     Prior to  Admission medications   Medication Sig Start Date End Date Taking? Authorizing Provider  acetaminophen (TYLENOL) 325 MG tablet Take 650 mg by mouth every 6 (six) hours as needed.    [provider]  aspirin 81 MG chewable tablet Chew 1 tablet (81 mg total) by mouth daily. Patient not taking: Reported on 01/15/2021 12/12/20   Rasch, Anderson Malta I, NP  aspirin-acetaminophen-caffeine (EXCEDRIN MIGRAINE) [phone] MG tablet Take 1 tablet by mouth every 6 (six) hours as needed for headache. Patient not taking: No sig reported    [provider]  Blood Pressure Monitoring (BLOOD PRESSURE KIT) DEVI 1 kit by Does not apply route once a week. 10/14/20   Constant, Peggy, MD  cetirizine (ZYRTEC ALLERGY) 10 MG tablet Take 1 tablet (10 mg total) by mouth daily. Patient not taking: No sig reported 03/02/20   Darr, Edison Nasuti, PA-C  Cyanocobalamin (B-12 PO) Take 1 tablet by mouth daily. Patient not taking: Reported on 12/12/2020    [provider]  Doxylamine-Pyridoxine (DICLEGIS) 10-10 MG TBEC Take 2 tablets by mouth at bedtime. If symptoms persist, add one tablet in the morning and one in the afternoon Patient not taking: Reported on 12/12/2020 11/07/20   Constant, Peggy, MD  fluticasone (FLONASE) 50 MCG/ACT nasal spray Place 1 spray into both nostrils daily. Patient not taking: Reported on 12/12/2020 03/02/20   Darr, Edison Nasuti, PA-C  ibuprofen (ADVIL) 600 MG tablet Take 600 mg by mouth every 6 (six) hours as needed. 06/07/21   [provider]  ondansetron (ZOFRAN) 4 MG tablet Take 1 tablet (4 mg total) by mouth every 8 (eight) hours as needed for nausea or vomiting. 12/31/20   Rasch, Artist Pais, NP  Prenatal Vit-Fe Fumarate-FA (PRENATAL MULTIVITAMIN) TABS tablet Take 1 tablet by mouth daily at 12 noon.    [provider]    Physical Exam:  Constitutional: Young obese female who appears to be in no acute distress Vitals:   06/13/21 1232 06/13/21 1412 06/13/21 1430 06/13/21 1530  BP:  (!) 142/87 119/74 129/82 134/81  Pulse: 81 94 86 84  Resp: 18 10 16    Temp:      TempSrc:      SpO2: 96% 99% 99% 96%  Weight:      Height:       Eyes: PERRL, lids and conjunctivae normal ENMT: Mucous membranes are moist. Posterior pharynx clear of any exudate or lesions.  Neck: normal, supple, no masses, no thyromegaly Respiratory: Respiratory effort with intermittent crackles appreciated.  No significant wheezes or rhonchi appreciated.  Patient currently on 2 L nasal cannula oxygen with O2 saturation maintained Cardiovascular: Regular rate and rhythm, no murmurs / rubs / gallops.  1+ pitting bilateral lower extremity edema. 2+ pedal pulses. No carotid bruits.  Abdomen: Protuberant abdomen with bowel sounds present. Musculoskeletal: no clubbing / cyanosis. No joint deformity upper and lower extremities. Good ROM, no contractures. Normal muscle tone.  Skin: C-section wound appears to be healing well with no erythema appreciated. Neurologic: CN 2-12 grossly intact. Sensation intact, DTR normal. Strength 5/5 in all 4.  Psychiatric: Normal judgment and  insight. Alert and oriented x 3.  Normal mood.    Labs on Admission: I have personally reviewed following labs and imaging studies  CBC: Recent Labs  Lab 06/12/21 2123 06/13/21 0957  WBC 8.6 8.7  NEUTROABS 6.4 6.3  HGB 8.7* 9.4*  HCT 27.9* 30.1*  MCV 86.4 84.3  PLT 346 038   Basic Metabolic Panel: Recent Labs  Lab 06/12/21 2123 06/13/21 0957  NA 138 141  K 3.6 3.8  CL 106 107  CO2 23 25  GLUCOSE 79 73  BUN 11 11  CREATININE 0.69 0.75  CALCIUM 8.5* 8.9   GFR: Estimated Creatinine Clearance: 138.4 mL/min (by C-G formula based on SCr of 0.75 mg/dL). Liver Function Tests: Recent Labs  Lab 06/13/21 0957  AST 16  ALT 17  ALKPHOS 116  BILITOT 0.6  PROT 6.6  ALBUMIN 3.4*   No results for input(s): LIPASE, AMYLASE in the last 168 hours. No results for input(s): AMMONIA in the last 168 hours. Coagulation  Profile: Recent Labs  Lab 06/13/21 0957  INR 1.0   Cardiac Enzymes: No results for input(s): CKTOTAL, CKMB, CKMBINDEX, TROPONINI in the last 168 hours. BNP (last 3 results) No results for input(s): PROBNP in the last 8760 hours. HbA1C: No results for input(s): HGBA1C in the last 72 hours. CBG: No results for input(s): GLUCAP in the last 168 hours. Lipid Profile: No results for input(s): CHOL, HDL, LDLCALC, TRIG, CHOLHDL, LDLDIRECT in the last 72 hours. Thyroid Function Tests: No results for input(s): TSH, T4TOTAL, FREET4, T3FREE, THYROIDAB in the last 72 hours. Anemia Panel: No results for input(s): VITAMINB12, FOLATE, FERRITIN, TIBC, IRON, RETICCTPCT in the last 72 hours. Urine analysis:    Component Value Date/Time   COLORURINE YELLOW 02/08/2017 1100   APPEARANCEUR CLEAR 02/08/2017 1100   LABSPEC 1.020 02/08/2017 1100   PHURINE 5.0 02/08/2017 1100   GLUCOSEU NEGATIVE 02/08/2017 1100   HGBUR NEGATIVE 02/08/2017 1100   BILIRUBINUR NEGATIVE 02/08/2017 1100   KETONESUR NEGATIVE 02/08/2017 1100   PROTEINUR NEGATIVE 02/08/2017 1100   UROBILINOGEN 0.2 05/20/2015 1530   NITRITE NEGATIVE 02/08/2017 1100   LEUKOCYTESUR TRACE (A) 02/08/2017 1100   Sepsis Labs: Recent Results (from the past 240 hour(s))  Resp Panel by RT-PCR (Flu A&B, Covid) Nasopharyngeal Swab     Status: None   Collection Time: 06/13/21  9:57 AM   Specimen: Nasopharyngeal Swab; Nasopharyngeal(NP) swabs in vial transport medium  Result Value Ref Range Status   SARS Coronavirus 2 by RT PCR NEGATIVE NEGATIVE Final    Comment: (NOTE) SARS-CoV-2 target nucleic acids are NOT DETECTED.  The SARS-CoV-2 RNA is generally detectable in upper respiratory specimens during the acute phase of infection. The lowest concentration of SARS-CoV-2 viral copies this assay can detect is 138 copies/mL. A negative result does not preclude SARS-Cov-2 infection and should not be used as the sole basis for treatment or other patient  management decisions. A negative result may occur with  improper specimen collection/handling, submission of specimen other than nasopharyngeal swab, presence of viral mutation(s) within the areas targeted by this assay, and inadequate number of viral copies(<138 copies/mL). A negative result must be combined with clinical observations, patient history, and epidemiological information. The expected result is Negative.  Fact Sheet for Patients:  EntrepreneurPulse.com.au  Fact Sheet for Healthcare Providers:  IncredibleEmployment.be  This test is no t yet approved or cleared by the [place] FDA and  has been authorized for detection and/or diagnosis of SARS-CoV-2 by FDA under an Emergency Use Authorization (  EUA). This EUA will remain  in effect (meaning this test can be used) for the duration of the COVID-19 declaration under Section 564(b)(1) of the Act, 21 U.S.C.section 360bbb-3(b)(1), unless the authorization is terminated  or revoked sooner.       Influenza A by PCR NEGATIVE NEGATIVE Final   Influenza B by PCR NEGATIVE NEGATIVE Final    Comment: (NOTE) The Xpert Xpress SARS-CoV-2/FLU/RSV plus assay is intended as an aid in the diagnosis of influenza from Nasopharyngeal swab specimens and should not be used as a sole basis for treatment. Nasal washings and aspirates are unacceptable for Xpert Xpress SARS-CoV-2/FLU/RSV testing.  Fact Sheet for Patients: EntrepreneurPulse.com.au  Fact Sheet for Healthcare Providers: IncredibleEmployment.be  This test is not yet approved or cleared by the [place] FDA and has been authorized for detection and/or diagnosis of SARS-CoV-2 by FDA under an Emergency Use Authorization (EUA). This EUA will remain in effect (meaning this test can be used) for the duration of the COVID-19 declaration under Section 564(b)(1) of the Act, 21 U.S.C. section 360bbb-3(b)(1),  unless the authorization is terminated or revoked.  Performed at [facility], [address] [place], [place] [address]   Culture, blood (routine x 2)     Status: None (Preliminary result)   Collection Time: 06/13/21 11:18 AM   Specimen: BLOOD RIGHT HAND  Result Value Ref Range Status   Specimen Description   Final    BLOOD RIGHT HAND Performed at [facility], [address] [place], [place] [address]    Special Requests   Final    BOTTLES DRAWN AEROBIC AND ANAEROBIC Blood Culture adequate volume Performed at [facility] [address] [place], [place] [address]    Culture PENDING  Incomplete   Report Status PENDING  Incomplete  Culture, blood (routine x 2)     Status: None (Preliminary result)   Collection Time: 06/13/21 11:29 AM   Specimen: BLOOD  Result Value Ref Range Status   Specimen Description BLOOD RIGHT ANTECUBITAL  Final   Special Requests   Final    BOTTLES DRAWN AEROBIC AND ANAEROBIC Blood Culture adequate volume Performed at [facility], [place] [address], [place], [place] [address]    Culture PENDING  Incomplete   Report Status PENDING  Incomplete     Radiological Exams on Admission: CT Angio Chest PE W and/or Wo Contrast  Result Date: 06/13/2021 CLINICAL DATA:  Acute shortness of breath, headache, 8 days postpartum EXAM: CT ANGIOGRAPHY CHEST WITH CONTRAST TECHNIQUE: Multidetector CT imaging of the chest was performed using the standard protocol during bolus administration of intravenous contrast. Multiplanar CT image reconstructions and MIPs were obtained to evaluate the vascular anatomy. CONTRAST:  29m OMNIPAQUE IOHEXOL 350 MG/ML SOLN COMPARISON:  None. FINDINGS: Cardiovascular: Pulmonary arteries are patent and normal in caliber. Negative for significant acute pulmonary embolus by CTA. Intact thoracic aorta. Negative for aneurysm or dissection. Normal heart size. No pericardial effusion. Central venous  structures appear patent.  No veno-occlusive process. Mediastinum/Nodes: Mildly enlarged thyroid with slight substernal extension. Trachea and central airways are patent. Esophagus nondilated. No adenopathy. Residual thymic triangular soft tissue suspected in the anterior mediastinum. Lungs/Pleura: Consolidative patchy peribronchovascular airspace opacities throughout all lobes of both lungs with a more central pattern of distribution. Airspace process worse in the lower lobes and the lung apices. Appearance compatible with extensive bilateral pneumonia or acute alveolar edema. Minor subsegmental bibasilar atelectasis. Symmetric small pleural effusions. No pneumothorax. Upper Abdomen: No acute abnormality.  Musculoskeletal: No chest wall abnormality. No acute or significant osseous findings. Review of the MIP images confirms the above findings. IMPRESSION: Negative for significant acute pulmonary embolus. Extensive multilobar patchy consolidative airspace process with a central pattern of distribution favored to be multifocal pneumonia versus acute alveolar edema. Associated bilateral small non loculated pleural effusions. Electronically Signed   By: Jerilynn Mages.  Shick M.D.   On: 06/13/2021 10:50   DG Chest Port 1 View  Result Date: 06/13/2021 CLINICAL DATA:  Shortness of breath. EXAM: PORTABLE CHEST 1 VIEW COMPARISON:  None. FINDINGS: The heart size and mediastinal contours are within normal limits. No pneumothorax or pleural effusion is noted. Mild central pulmonary vascular congestion is noted. Mild bilateral pulmonary edema possibly atypical inflammation may be present. The visualized skeletal structures are unremarkable. IMPRESSION: Mild central pulmonary vascular congestion is noted with possible mild bilateral pulmonary edema or possibly atypical inflammation. Electronically Signed   By: Marijo Conception M.D.   On: 06/13/2021 10:31    EKG: Independently reviewed.   Assessment/Plan Acute respiratory failure with  hypoxia secondary to congestive heart failure exacerbation: Patient presents with complaints of shortness of breath that woke her out of her sleep.  O2 saturation noted to be as low as 88% with improvement on 2 L nasal cannula oxygen.  Imaging studies concerning for edema versus pneumonia with no signs of a pulmonary embolus.  She was otherwise afebrile without complaints of cough, but initially started on Rocephin and azithromycin for concern of pneumonia.  BNP was elevated at 220.2 and she has lower extremity swelling on physical exam.  Question possible postpartum cardiomyopathy with heart failure exacerbation given patient's heart history more so than pneumonia. -Admit to a telemetry bed -Heart failure order set utilized -Continuous pulse oximetry with nasal cannula oxygen to maintain O2 saturation greater than 92% -Check TSH  -Check echocardiogram -Lasix 40 mg IV twice daily -Follow-up telemetry overnight -Consider need of formal consult to cardiology i in a.m. depending on echocardiogram  Possible healthcare associated pneumonia: Seems less likely given patient's history. -Follow-up procalcitonin -Reevaluate need to continue antibiotics in a.m.  Recent C-section: Patient is postop day 8 from cesarean section with tubal ligation.  Normocytic anemia: Hemoglobin 9.4 which appears slightly similar to previous on 8/12 of 9.2 g/dL. -Continue to monitor  Family history of heart disease: Patient reports both of her maternal uncle and grandmother died at the age of 2 in relation to heart disease.  History of tobacco use: Patient reported that she used to smoke cigarettes prior to finding out that she was pregnant. -Continue to encourage cessation of tobacco use  Morbid obesity: BMI 55.02 kg/m  DVT prophylaxis: Lovenox Code Status: Full Family Communication: None request Disposition Plan: Home Consults called: None Admission status: Inpatient, require more than 2 midnight stay  Norval Morton MD [facility]   If 7PM-7AM, please contact night-coverage   06/13/2021, 4:17 PM

## 2021-06-13 NOTE — ED Notes (Signed)
Pt. placed on 2 lpm n/c per sats <'d to 88%, MD made aware.

## 2021-06-13 NOTE — ED Notes (Signed)
Pt transported to bathroom via wheelchair, pt tolerated well with assistance.

## 2021-06-14 DIAGNOSIS — D649 Anemia, unspecified: Secondary | ICD-10-CM | POA: Diagnosis not present

## 2021-06-14 DIAGNOSIS — J9601 Acute respiratory failure with hypoxia: Secondary | ICD-10-CM | POA: Diagnosis not present

## 2021-06-14 LAB — BASIC METABOLIC PANEL
Anion gap: 7 (ref 5–15)
BUN: 9 mg/dL (ref 6–20)
CO2: 25 mmol/L (ref 22–32)
Calcium: 8.6 mg/dL — ABNORMAL LOW (ref 8.9–10.3)
Chloride: 107 mmol/L (ref 98–111)
Creatinine, Ser: 0.74 mg/dL (ref 0.44–1.00)
GFR, Estimated: >60 mL/min (ref >60)
Glucose, Bld: 85 mg/dL (ref 70–99)
Potassium: 3.7 mmol/L (ref 3.5–5.1)
Sodium: 139 mmol/L (ref 135–145)

## 2021-06-14 LAB — ECHOCARDIOGRAM COMPLETE
Area-P 1/2: 2.83 cm2
Height: 61 in
S' Lateral: 3.1 cm
Weight: 4659.2 oz

## 2021-06-14 LAB — MAGNESIUM: Magnesium: 1.9 mg/dL (ref 1.7–2.4)

## 2021-06-14 MED ORDER — POTASSIUM CHLORIDE CRYS ER 20 MEQ PO TBCR
40.0000 meq | EXTENDED_RELEASE_TABLET | Freq: Once | ORAL | Status: AC
  Administered 2021-06-14: 40 meq via ORAL
  Filled 2021-06-14: qty 2

## 2021-06-14 MED ORDER — SODIUM CHLORIDE 0.9 % IV SOLN
2.0000 g | INTRAVENOUS | Status: DC
  Administered 2021-06-14: 2 g via INTRAVENOUS
  Filled 2021-06-14: qty 20

## 2021-06-14 NOTE — Progress Notes (Signed)
[facility] PROGRESS NOTE   [name] [name] MOQ:[id number] DOB: 1996/02/25 DOA: 06/13/2021  PCP: Patient, No Pcp Per (Inactive)  Brief History/Interval Summary: 25 y.o. female with medical history significant of morbid obesity and cesarean section with tubal ligation on 8/11 who presented with complaints of shortness of breath.  Symptoms woke her out of sleep.  Associated symptoms included complaints of some wheezing, headache, and neck pain.  She is postop day 8 from cesarean section with spinal block giving birth to a baby boy who is doing well.  Patient was found to have elevated BNP.  Chest x-ray raised concern for pulmonary edema.  CT angiogram of the chest raised concern for pneumonia versus pulmonary edema.  Patient was hospitalized for further management.     Consultants: None yet  Procedures: Transthoracic echocardiogram is pending  Antibiotics: Anti-infectives (From admission, onward)    Start     Dose/Rate Route Frequency Ordered Stop   06/13/21 1115  cefTRIAXone (ROCEPHIN) 2 g in sodium chloride 0.9 % 100 mL IVPB        2 g 200 mL/hr over 30 Minutes Intravenous  Once 06/13/21 1105 06/13/21 1321   06/13/21 1115  azithromycin (ZITHROMAX) 500 mg in sodium chloride 0.9 % 250 mL IVPB        500 mg 250 mL/hr over 60 Minutes Intravenous Every 24 hours 06/13/21 1105         Subjective/Interval History: Patient mentions that she is feeling slightly better today.  She mentions that her leg swelling has improved.  Shortness of breath persist.  She denies any cough or fever.  Denies any nausea or vomiting    Assessment/Plan:  Acute respiratory failure with hypoxia Patient was admitted with saturations of 88% on room air.  She was dyspneic.  As mentioned above imaging studies raise concern for pulmonary edema versus pneumonia.  Patient is recently postpartum.  Patient was started on IV furosemide along with the antibiotics.  Echocardiogram is pending. Lactic acid level  was normal.  Procalcitonin less than 0.1.  Patient does feel symptomatically better which raises concern for more volume overload rather than an infectious process.  Continue with intravenous Lasix for now.  TSH is noted to be normal.  Renal function is normal.  Replace potassium.  Normocytic anemia Seems to be stable.  No evidence of overt bleeding.  History of tobacco abuse Continue to encourage smoking cessation.  Class III obesity Estimated body mass index is 53.4 kg/m as calculated from the following:   Height as of this encounter: 5\' 1"  (1.549 m).   Weight as of this encounter: 128.2 kg.    DVT Prophylaxis: Lovenox Code Status: Full code Family Communication: Discussed with the patient Disposition Plan: Hopefully return home when improved  Status is: Inpatient  Remains inpatient appropriate because:Ongoing diagnostic testing needed not appropriate for outpatient work up and IV treatments appropriate due to intensity of illness or inability to take PO  Dispo: The patient is from: Home              Anticipated d/c is to: Home              Patient currently is not medically stable to d/c.   Difficult to place patient No       Medications: Scheduled:  enoxaparin (LOVENOX) injection  40 mg Subcutaneous Q24H   furosemide  40 mg Intravenous BID   sodium chloride flush  3 mL Intravenous Q12H   Continuous:  sodium chloride  azithromycin 500 mg (06/13/21 1311)   FOY:DXAJOI chloride, acetaminophen, ondansetron (ZOFRAN) IV, sodium chloride flush   Objective:  Vital Signs  Vitals:   06/13/21 2041 06/14/21 0026 06/14/21 0448 06/14/21 0740  BP: (!) 136/97 130/77 121/84 115/69  Pulse: 94 100 94 89  Resp: 20 20 20 18   Temp: 98.4 F (36.9 C) 98.4 F (36.9 C) 98.7 F (37.1 C) 98.7 F (37.1 C)  TempSrc: Oral Oral Oral Oral  SpO2: 100% 100% 98% 99%  Weight:   128.2 kg   Height:        Intake/Output Summary (Last 24 hours) at 06/14/2021 0945 Last data filed at  06/14/2021 0800 Gross per 24 hour  Intake 343 ml  Output 200 ml  Net 143 ml   Filed Weights   06/13/21 0935 06/13/21 1626 06/14/21 0448  Weight: 135.9 kg 132.1 kg 128.2 kg    General appearance: Awake alert.  In no distress Resp: Normal effort at rest.  Crackles bilateral bases.  No wheezing or rhonchi. Cardio: S1-S2 is normal regular.  No S3-S4.  No rubs murmurs or bruit GI: Abdomen is soft.  Nontender nondistended.  Bowel sounds are present normal.  No masses organomegaly Extremities: 1+ edema bilateral lower extremity Neurologic: Alert and oriented x3.  No focal neurological deficits.    Lab Results:  Data Reviewed: I have personally reviewed following labs and imaging studies  CBC: Recent Labs  Lab 06/12/21 2123 06/13/21 0957  WBC 8.6 8.7  NEUTROABS 6.4 6.3  HGB 8.7* 9.4*  HCT 27.9* 30.1*  MCV 86.4 84.3  PLT 346 379    Basic Metabolic Panel: Recent Labs  Lab 06/12/21 2123 06/13/21 0957 06/14/21 0227  NA 138 141 139  K 3.6 3.8 3.7  CL 106 107 107  CO2 23 25 25   GLUCOSE 79 73 85  BUN 11 11 9   CREATININE 0.69 0.75 0.74  CALCIUM 8.5* 8.9 8.6*  MG  --   --  1.9    GFR: Estimated Creatinine Clearance: 133.4 mL/min (by C-G formula based on SCr of 0.74 mg/dL).  Liver Function Tests: Recent Labs  Lab 06/13/21 0957  AST 16  ALT 17  ALKPHOS 116  BILITOT 0.6  PROT 6.6  ALBUMIN 3.4*      Coagulation Profile: Recent Labs  Lab 06/13/21 0957  INR 1.0     Thyroid Function Tests: Recent Labs    06/13/21 1916  TSH 3.276      Recent Results (from the past 240 hour(s))  Resp Panel by RT-PCR (Flu A&B, Covid) Nasopharyngeal Swab     Status: None   Collection Time: 06/13/21  9:57 AM   Specimen: Nasopharyngeal Swab; Nasopharyngeal(NP) swabs in vial transport medium  Result Value Ref Range Status   SARS Coronavirus 2 by RT PCR NEGATIVE NEGATIVE Final    Comment: (NOTE) SARS-CoV-2 target nucleic acids are NOT DETECTED.  The SARS-CoV-2 RNA is  generally detectable in upper respiratory specimens during the acute phase of infection. The lowest concentration of SARS-CoV-2 viral copies this assay can detect is 138 copies/mL. A negative result does not preclude SARS-Cov-2 infection and should not be used as the sole basis for treatment or other patient management decisions. A negative result may occur with  improper specimen collection/handling, submission of specimen other than nasopharyngeal swab, presence of viral mutation(s) within the areas targeted by this assay, and inadequate number of viral copies(<138 copies/mL). A negative result must be combined with clinical observations, patient history, and epidemiological information. The expected  result is Negative.  Fact Sheet for Patients:  BloggerCourse.com  Fact Sheet for Healthcare Providers:  SeriousBroker.it  This test is no t yet approved or cleared by the [place] FDA and  has been authorized for detection and/or diagnosis of SARS-CoV-2 by FDA under an Emergency Use Authorization (EUA). This EUA will remain  in effect (meaning this test can be used) for the duration of the COVID-19 declaration under Section 564(b)(1) of the Act, 21 U.S.C.section 360bbb-3(b)(1), unless the authorization is terminated  or revoked sooner.       Influenza A by PCR NEGATIVE NEGATIVE Final   Influenza B by PCR NEGATIVE NEGATIVE Final    Comment: (NOTE) The Xpert Xpress SARS-CoV-2/FLU/RSV plus assay is intended as an aid in the diagnosis of influenza from Nasopharyngeal swab specimens and should not be used as a sole basis for treatment. Nasal washings and aspirates are unacceptable for Xpert Xpress SARS-CoV-2/FLU/RSV testing.  Fact Sheet for Patients: BloggerCourse.com  Fact Sheet for Healthcare Providers: SeriousBroker.it  This test is not yet approved or cleared by the [place] FDA and has been authorized for detection and/or diagnosis of SARS-CoV-2 by FDA under an Emergency Use Authorization (EUA). This EUA will remain in effect (meaning this test can be used) for the duration of the COVID-19 declaration under Section 564(b)(1) of the Act, 21 U.S.C. section 360bbb-3(b)(1), unless the authorization is terminated or revoked.  Performed at [facility], [address] [place], [place] [address]   Culture, blood (routine x 2)     Status: None (Preliminary result)   Collection Time: 06/13/21 11:18 AM   Specimen: BLOOD RIGHT HAND  Result Value Ref Range Status   Specimen Description   Final    BLOOD RIGHT HAND Performed at [facility], [address], [place], [place] [address]    Special Requests   Final    BOTTLES DRAWN AEROBIC AND ANAEROBIC Blood Culture adequate volume   Culture   Final    NO GROWTH < 24 HOURS Performed at [facility], [address] [place], [place] [address]    Report Status PENDING  Incomplete  Culture, blood (routine x 2)     Status: None (Preliminary result)   Collection Time: 06/13/21 11:29 AM   Specimen: BLOOD  Result Value Ref Range Status   Specimen Description BLOOD RIGHT ANTECUBITAL  Final   Special Requests   Final    BOTTLES DRAWN AEROBIC AND ANAEROBIC Blood Culture adequate volume   Culture   Final    NO GROWTH < 24 HOURS Performed at [facility], [address], [place], [place] [address]    Report Status PENDING  Incomplete      Radiology Studies: CT Angio Chest PE W and/or Wo Contrast  Result Date: 06/13/2021 CLINICAL DATA:  Acute shortness of breath, headache, 8 days postpartum EXAM: CT ANGIOGRAPHY CHEST WITH CONTRAST TECHNIQUE: Multidetector CT imaging of the chest was performed using the standard protocol during bolus administration of intravenous contrast. Multiplanar CT image reconstructions and MIPs were obtained to evaluate the vascular  anatomy. CONTRAST:  27mL OMNIPAQUE IOHEXOL 350 MG/ML SOLN COMPARISON:  None. FINDINGS: Cardiovascular: Pulmonary arteries are patent and normal in caliber. Negative for significant acute pulmonary embolus by CTA. Intact thoracic aorta. Negative for aneurysm or dissection. Normal heart size. No pericardial effusion. Central venous structures appear patent.  No veno-occlusive process. Mediastinum/Nodes: Mildly enlarged thyroid with slight substernal extension. Trachea and central airways are patent. Esophagus nondilated. No  adenopathy. Residual thymic triangular soft tissue suspected in the anterior mediastinum. Lungs/Pleura: Consolidative patchy peribronchovascular airspace opacities throughout all lobes of both lungs with a more central pattern of distribution. Airspace process worse in the lower lobes and the lung apices. Appearance compatible with extensive bilateral pneumonia or acute alveolar edema. Minor subsegmental bibasilar atelectasis. Symmetric small pleural effusions. No pneumothorax. Upper Abdomen: No acute abnormality. Musculoskeletal: No chest wall abnormality. No acute or significant osseous findings. Review of the MIP images confirms the above findings. IMPRESSION: Negative for significant acute pulmonary embolus. Extensive multilobar patchy consolidative airspace process with a central pattern of distribution favored to be multifocal pneumonia versus acute alveolar edema. Associated bilateral small non loculated pleural effusions. Electronically Signed   By: Judie PetitM.  Shick M.D.   On: 06/13/2021 10:50   DG Chest Port 1 View  Result Date: 06/13/2021 CLINICAL DATA:  Shortness of breath. EXAM: PORTABLE CHEST 1 VIEW COMPARISON:  None. FINDINGS: The heart size and mediastinal contours are within normal limits. No pneumothorax or pleural effusion is noted. Mild central pulmonary vascular congestion is noted. Mild bilateral pulmonary edema possibly atypical inflammation may be present. The visualized  skeletal structures are unremarkable. IMPRESSION: Mild central pulmonary vascular congestion is noted with possible mild bilateral pulmonary edema or possibly atypical inflammation. Electronically Signed   By: Lupita RaiderJames  Green Jr M.D.   On: 06/13/2021 10:31       LOS: 1 day   Graciemae Delisle  [facility] Pager on [email or web address]  06/14/2021, 9:45 AM

## 2021-06-14 NOTE — Plan of Care (Signed)
  Problem: Elimination: Goal: Will not experience complications related to urinary retention Outcome: Progressing   Problem: Safety: Goal: Ability to remain free from injury will improve Outcome: Progressing   

## 2021-06-15 DIAGNOSIS — J9601 Acute respiratory failure with hypoxia: Secondary | ICD-10-CM | POA: Diagnosis not present

## 2021-06-15 LAB — CBC
HCT: 31.7 % — ABNORMAL LOW (ref 36.0–46.0)
Hemoglobin: 10 g/dL — ABNORMAL LOW (ref 12.0–15.0)
MCH: 26.7 pg (ref 26.0–34.0)
MCHC: 31.5 g/dL (ref 30.0–36.0)
MCV: 84.8 fL (ref 80.0–100.0)
Platelets: 406 10*3/uL — ABNORMAL HIGH (ref 150–400)
RBC: 3.74 MIL/uL — ABNORMAL LOW (ref 3.87–5.11)
RDW: 15.1 % (ref 11.5–15.5)
WBC: 8.1 10*3/uL (ref 4.0–10.5)
nRBC: 0 % (ref 0.0–0.2)

## 2021-06-15 LAB — BASIC METABOLIC PANEL
Anion gap: 9 (ref 5–15)
BUN: 11 mg/dL (ref 6–20)
CO2: 25 mmol/L (ref 22–32)
Calcium: 9 mg/dL (ref 8.9–10.3)
Chloride: 106 mmol/L (ref 98–111)
Creatinine, Ser: 0.78 mg/dL (ref 0.44–1.00)
GFR, Estimated: >60 mL/min (ref >60)
Glucose, Bld: 88 mg/dL (ref 70–99)
Potassium: 3.7 mmol/L (ref 3.5–5.1)
Sodium: 140 mmol/L (ref 135–145)

## 2021-06-15 MED ORDER — POTASSIUM CHLORIDE CRYS ER 20 MEQ PO TBCR: 20.0000 meq | EXTENDED_RELEASE_TABLET | Freq: Every day | ORAL | 0 refills | Status: DC

## 2021-06-15 MED ORDER — AZITHROMYCIN 250 MG PO TABS
500.0000 mg | ORAL_TABLET | Freq: Every day | ORAL | Status: DC
  Administered 2021-06-15: 500 mg via ORAL
  Filled 2021-06-15: qty 2

## 2021-06-15 MED ORDER — FUROSEMIDE 40 MG PO TABS
ORAL_TABLET | ORAL | 0 refills | Status: DC
Start: 2021-06-15 — End: 2022-08-01

## 2021-06-15 MED ORDER — AZITHROMYCIN 500 MG PO TABS: 500.0000 mg | ORAL_TABLET | Freq: Every day | ORAL | 0 refills | Status: AC

## 2021-06-15 MED ORDER — CEFDINIR 300 MG PO CAPS: 300.0000 mg | ORAL_CAPSULE | Freq: Two times a day (BID) | ORAL | 0 refills | Status: AC

## 2021-06-15 MED ORDER — FUROSEMIDE 40 MG PO TABS: 40.0000 mg | ORAL_TABLET | Freq: Every day | ORAL | Status: DC

## 2021-06-15 MED ORDER — CEFDINIR 300 MG PO CAPS
300.0000 mg | ORAL_CAPSULE | Freq: Two times a day (BID) | ORAL | Status: DC
  Administered 2021-06-15: 300 mg via ORAL
  Filled 2021-06-15: qty 1

## 2021-06-15 NOTE — Plan of Care (Signed)
  Problem: Skin Integrity: Goal: Risk for impaired skin integrity will decrease Outcome: Completed/Met   Problem: Safety: Goal: Ability to remain free from injury will improve Outcome: Completed/Met   Problem: Pain Managment: Goal: General experience of comfort will improve Outcome: Completed/Met   Problem: Elimination: Goal: Will not experience complications related to urinary retention Outcome: Completed/Met   Problem: Elimination: Goal: Will not experience complications related to bowel motility Outcome: Completed/Met   Problem: Coping: Goal: Level of anxiety will decrease Outcome: Completed/Met   Problem: Nutrition: Goal: Adequate nutrition will be maintained Outcome: Completed/Met   Problem: Clinical Measurements: Goal: Cardiovascular complication will be avoided Outcome: Progressing   Problem: Clinical Measurements: Goal: Respiratory complications will improve Outcome: Completed/Met   Problem: Clinical Measurements: Goal: Will remain free from infection Outcome: Completed/Met

## 2021-06-15 NOTE — Progress Notes (Signed)
SATURATION QUALIFICATIONS: (This note is used to comply with regulatory documentation for home oxygen)  Patient Saturations on Room Air at Rest = 96%  Patient Saturations on Room Air while Ambulating = 94%  Pt ambulating at room air. Pt denied shortness of breath or chest pains. Highest HR while ambulating 120.  At rest HR 103.

## 2021-06-15 NOTE — Discharge Summary (Signed)
[facility]  Physician Discharge Summary   Patient ID: [name] [name] MRN: [id number] DOB/AGE: 04-15-1996 25 y.o.  Admit date: 06/13/2021 Discharge date: 06/15/2021    PCP: Patient, No Pcp Per (Inactive)  DISCHARGE DIAGNOSES:  Acute respiratory failure with hypoxia, resolved Community-acquired pneumonia Possible volume overload Normocytic anemia   RECOMMENDATIONS FOR OUTPATIENT FOLLOW UP: Follow-up with primary care provider in 7 to 10 days    Home Health: None Equipment/Devices: None  CODE STATUS: Full code  DISCHARGE CONDITION: fair  Diet recommendation: Regular as tolerated  INITIAL HISTORY: 25 y.o. female with medical history significant of morbid obesity and cesarean section with tubal ligation on 8/11 who presented with complaints of shortness of breath.  Symptoms woke her out of sleep.  Associated symptoms included complaints of some wheezing, headache, and neck pain.  She is postop day 8 from cesarean section with spinal block giving birth to a baby boy who is doing well.  Patient was found to have elevated BNP.  Chest x-ray raised concern for pulmonary edema.  CT angiogram of the chest raised concern for pneumonia versus pulmonary edema.  Patient was hospitalized for further management.      Consultants: None yet   Procedures: Transthoracic echocardiogram   HOSPITAL COURSE:   Community-acquired pneumonia/acute respiratory failure with hypoxia, resolved Patient was admitted with saturations of 88% on room air.  She was dyspneic.  Imaging studies raised concern for pulmonary edema versus pneumonia.  CTA did not show any pulmonary embolism.  She was recently postpartum.  She was given IV furosemide along with antibiotics.  Echocardiogram reassuringly showed normal cardiac function.  Patient experienced improvement.  Was weaned off oxygen.   Patient was transitioned to oral antibiotics.  She will also be given oral Lasix for a few days.  She was asked to  follow-up with her PCP.  TSH was normal.  Renal function remains normal.  Electrolytes were repleted.     Normocytic anemia Seems to be stable.  No evidence of overt bleeding.  History of tobacco abuse Continue to encourage smoking cessation.  Class III obesity Estimated body mass index is 53.4 kg/m as calculated from the following:   Height as of this encounter: 5' 1"  (1.549 m).   Weight as of this encounter: 128.2 kg.    Patient has improved.  Feels much better.  Has been weaned off of oxygen.  Okay for discharge home today.  PERTINENT LABS:  The results of significant diagnostics from this hospitalization (including imaging, microbiology, ancillary and laboratory) are listed below for reference.    Microbiology: Recent Results (from the past 240 hour(s))  Resp Panel by RT-PCR (Flu A&B, Covid) Nasopharyngeal Swab     Status: None   Collection Time: 06/13/21  9:57 AM   Specimen: Nasopharyngeal Swab; Nasopharyngeal(NP) swabs in vial transport medium  Result Value Ref Range Status   SARS Coronavirus 2 by RT PCR NEGATIVE NEGATIVE Final    Comment: (NOTE) SARS-CoV-2 target nucleic acids are NOT DETECTED.  The SARS-CoV-2 RNA is generally detectable in upper respiratory specimens during the acute phase of infection. The lowest concentration of SARS-CoV-2 viral copies this assay can detect is 138 copies/mL. A negative result does not preclude SARS-Cov-2 infection and should not be used as the sole basis for treatment or other patient management decisions. A negative result may occur with  improper specimen collection/handling, submission of specimen other than nasopharyngeal swab, presence of viral mutation(s) within the areas targeted by this assay, and inadequate number of viral  copies(<138 copies/mL). A negative result must be combined with clinical observations, patient history, and epidemiological information. The expected result is Negative.  Fact Sheet for Patients:   EntrepreneurPulse.com.au  Fact Sheet for Healthcare Providers:  IncredibleEmployment.be  This test is no t yet approved or cleared by the [place] FDA and  has been authorized for detection and/or diagnosis of SARS-CoV-2 by FDA under an Emergency Use Authorization (EUA). This EUA will remain  in effect (meaning this test can be used) for the duration of the COVID-19 declaration under Section 564(b)(1) of the Act, 21 U.S.C.section 360bbb-3(b)(1), unless the authorization is terminated  or revoked sooner.       Influenza A by PCR NEGATIVE NEGATIVE Final   Influenza B by PCR NEGATIVE NEGATIVE Final    Comment: (NOTE) The Xpert Xpress SARS-CoV-2/FLU/RSV plus assay is intended as an aid in the diagnosis of influenza from Nasopharyngeal swab specimens and should not be used as a sole basis for treatment. Nasal washings and aspirates are unacceptable for Xpert Xpress SARS-CoV-2/FLU/RSV testing.  Fact Sheet for Patients: EntrepreneurPulse.com.au  Fact Sheet for Healthcare Providers: IncredibleEmployment.be  This test is not yet approved or cleared by the [place] FDA and has been authorized for detection and/or diagnosis of SARS-CoV-2 by FDA under an Emergency Use Authorization (EUA). This EUA will remain in effect (meaning this test can be used) for the duration of the COVID-19 declaration under Section 564(b)(1) of the Act, 21 U.S.C. section 360bbb-3(b)(1), unless the authorization is terminated or revoked.  Performed at [facility], [address] [place], [place] [address]   Culture, blood (routine x 2)     Status: None (Preliminary result)   Collection Time: 06/13/21 11:18 AM   Specimen: BLOOD RIGHT HAND  Result Value Ref Range Status   Specimen Description   Final    BLOOD RIGHT HAND Performed at [facility], [address] [place], [place]  [address]    Special Requests   Final    BOTTLES DRAWN AEROBIC AND ANAEROBIC Blood Culture adequate volume   Culture   Final    NO GROWTH 3 DAYS Performed at [facility], [place] [address], [place], [place] [address]    Report Status PENDING  Incomplete  Culture, blood (routine x 2)     Status: None (Preliminary result)   Collection Time: 06/13/21 11:29 AM   Specimen: BLOOD  Result Value Ref Range Status   Specimen Description BLOOD RIGHT ANTECUBITAL  Final   Special Requests   Final    BOTTLES DRAWN AEROBIC AND ANAEROBIC Blood Culture adequate volume   Culture   Final    NO GROWTH 3 DAYS Performed at [facility], [place] [address], [place], [place] [address]    Report Status PENDING  Incomplete     Labs:  COVID-19 Labs   Lab Results  Component Value Date   SARSCOV2NAA NEGATIVE 06/13/2021   SARSCOV2NAA NEGATIVE 03/02/2020   SARSCOV2NAA Detected (A) 10/12/2019      Basic Metabolic Panel: Recent Labs  Lab 06/12/21 2123 06/13/21 0957 06/14/21 0227 06/15/21 0350  NA 138 141 139 140  K 3.6 3.8 3.7 3.7  CL 106 107 107 106  CO2 23 25 25 25   GLUCOSE 79 73 85 88  BUN 11 11 9 11   CREATININE 0.69 0.75 0.74 0.78  CALCIUM 8.5* 8.9 8.6* 9.0  MG  --   --  1.9  --    Liver Function Tests: Recent Labs  Lab  06/13/21 0957  AST 16  ALT 17  ALKPHOS 116  BILITOT 0.6  PROT 6.6  ALBUMIN 3.4*    CBC: Recent Labs  Lab 06/12/21 2123 06/13/21 0957 06/15/21 0350  WBC 8.6 8.7 8.1  NEUTROABS 6.4 6.3  --   HGB 8.7* 9.4* 10.0*  HCT 27.9* 30.1* 31.7*  MCV 86.4 84.3 84.8  PLT 346 379 406*    BNP: BNP (last 3 results) Recent Labs    06/13/21 0957  BNP 220.2*      IMAGING STUDIES CT Angio Chest PE W and/or Wo Contrast  Result Date: 06/13/2021 CLINICAL DATA:  Acute shortness of breath, headache, 8 days postpartum EXAM: CT ANGIOGRAPHY CHEST WITH CONTRAST TECHNIQUE: Multidetector CT imaging of the chest was performed using the standard protocol during bolus  administration of intravenous contrast. Multiplanar CT image reconstructions and MIPs were obtained to evaluate the vascular anatomy. CONTRAST:  32m OMNIPAQUE IOHEXOL 350 MG/ML SOLN COMPARISON:  None. FINDINGS: Cardiovascular: Pulmonary arteries are patent and normal in caliber. Negative for significant acute pulmonary embolus by CTA. Intact thoracic aorta. Negative for aneurysm or dissection. Normal heart size. No pericardial effusion. Central venous structures appear patent.  No veno-occlusive process. Mediastinum/Nodes: Mildly enlarged thyroid with slight substernal extension. Trachea and central airways are patent. Esophagus nondilated. No adenopathy. Residual thymic triangular soft tissue suspected in the anterior mediastinum. Lungs/Pleura: Consolidative patchy peribronchovascular airspace opacities throughout all lobes of both lungs with a more central pattern of distribution. Airspace process worse in the lower lobes and the lung apices. Appearance compatible with extensive bilateral pneumonia or acute alveolar edema. Minor subsegmental bibasilar atelectasis. Symmetric small pleural effusions. No pneumothorax. Upper Abdomen: No acute abnormality. Musculoskeletal: No chest wall abnormality. No acute or significant osseous findings. Review of the MIP images confirms the above findings. IMPRESSION: Negative for significant acute pulmonary embolus. Extensive multilobar patchy consolidative airspace process with a central pattern of distribution favored to be multifocal pneumonia versus acute alveolar edema. Associated bilateral small non loculated pleural effusions. Electronically Signed   By: MJerilynn Mages  Shick M.D.   On: 06/13/2021 10:50   DG Chest Port 1 View  Result Date: 06/13/2021 CLINICAL DATA:  Shortness of breath. EXAM: PORTABLE CHEST 1 VIEW COMPARISON:  None. FINDINGS: The heart size and mediastinal contours are within normal limits. No pneumothorax or pleural effusion is noted. Mild central pulmonary  vascular congestion is noted. Mild bilateral pulmonary edema possibly atypical inflammation may be present. The visualized skeletal structures are unremarkable. IMPRESSION: Mild central pulmonary vascular congestion is noted with possible mild bilateral pulmonary edema or possibly atypical inflammation. Electronically Signed   By: JMarijo ConceptionM.D.   On: 06/13/2021 10:31   ECHOCARDIOGRAM COMPLETE  Result Date: 06/14/2021    ECHOCARDIOGRAM REPORT   [name] Medical Rec #:  [id number]        Height:       61.0 in Accession #:    [id number]       Weight:       291.2 lb Date of Birth:  4Sep 21, 1997         BSA:          2.216 m Patient Age:    25 years          BP:           140/81 mmHg Patient Gender: F  HR:           91 bpm. Exam Location:  Inpatient Procedure: 2D Echo Indications:    acute diastolic chf  History:        Patient has no prior history of Echocardiogram examinations.  Sonographer:    Johny Chess RDCS Referring Phys: [phone] RONDELL A SMITH  Sonographer Comments: Image acquisition challenging due to patient body habitus. IMPRESSIONS  1. Left ventricular ejection fraction, by estimation, is 60 to 65%. The left ventricle has normal function. The left ventricle has no regional wall motion abnormalities. Left ventricular diastolic parameters were normal.  2. Right ventricular systolic function is normal. The right ventricular size is normal. There is mildly elevated pulmonary artery systolic pressure. The estimated right ventricular systolic pressure is 69.4 mmHg.  3. The mitral valve is normal in structure. Mild mitral valve regurgitation. No evidence of mitral stenosis.  4. The aortic valve is tricuspid. Aortic valve regurgitation is not visualized. No aortic stenosis is present.  5. The inferior vena cava is normal in size with greater than 50% respiratory variability, suggesting right atrial pressure of 3 mmHg. FINDINGS  Left  Ventricle: Left ventricular ejection fraction, by estimation, is 60 to 65%. The left ventricle has normal function. The left ventricle has no regional wall motion abnormalities. The left ventricular internal cavity size was normal in size. There is  no left ventricular hypertrophy. Left ventricular diastolic parameters were normal. Right Ventricle: The right ventricular size is normal. No increase in right ventricular wall thickness. Right ventricular systolic function is normal. There is mildly elevated pulmonary artery systolic pressure. The tricuspid regurgitant velocity is 2.95  m/s, and with an assumed right atrial pressure of 3 mmHg, the estimated right ventricular systolic pressure is 50.3 mmHg. Left Atrium: Left atrial size was normal in size. Right Atrium: Right atrial size was normal in size. Pericardium: There is no evidence of pericardial effusion. Mitral Valve: The mitral valve is normal in structure. Mild mitral valve regurgitation. No evidence of mitral valve stenosis. Tricuspid Valve: The tricuspid valve is normal in structure. Tricuspid valve regurgitation is trivial. Aortic Valve: The aortic valve is tricuspid. Aortic valve regurgitation is not visualized. No aortic stenosis is present. Pulmonic Valve: The pulmonic valve was normal in structure. Pulmonic valve regurgitation is trivial. Aorta: The aortic root is normal in size and structure. Venous: The inferior vena cava is normal in size with greater than 50% respiratory variability, suggesting right atrial pressure of 3 mmHg. IAS/Shunts: No atrial level shunt detected by color flow Doppler.  LEFT VENTRICLE PLAX 2D LVIDd:         4.90 cm  Diastology LVIDs:         3.10 cm  LV e' medial:    9.25 cm/s LV PW:         1.00 cm  LV E/e' medial:  16.6 LV IVS:        0.80 cm  LV e' lateral:   12.60 cm/s LVOT diam:     2.00 cm  LV E/e' lateral: 12.2 LV SV:         82 LV SV Index:   37 LVOT Area:     3.14 cm  RIGHT VENTRICLE             IVC RV S prime:      13.20 cm/s  IVC diam: 2.10 cm TAPSE (M-mode): 2.4 cm LEFT ATRIUM             Index  RIGHT ATRIUM           Index LA diam:        4.30 cm 1.94 cm/m  RA Area:     14.50 cm LA Vol (A2C):   54.1 ml 24.42 ml/m RA Volume:   33.80 ml  15.25 ml/m LA Vol (A4C):   60.9 ml 27.49 ml/m LA Biplane Vol: 62.1 ml 28.03 ml/m  AORTIC VALVE LVOT Vmax:   122.00 cm/s LVOT Vmean:  83.200 cm/s LVOT VTI:    0.260 m  AORTA Ao Root diam: 2.60 cm Ao Asc diam:  2.70 cm MITRAL VALVE                TRICUSPID VALVE MV Area (PHT): 2.83 cm     TR Peak grad:   34.8 mmHg MV Decel Time: 268 msec     TR Vmax:        295.00 cm/s MV E velocity: 154.00 cm/s MV A velocity: 67.60 cm/s   SHUNTS MV E/A ratio:  2.28         Systemic VTI:  0.26 m                             Systemic Diam: 2.00 cm Dalton McleanMD Electronically signed by Franki Monte Signature Date/Time: 06/14/2021/9:48:13 AM    Final     DISCHARGE EXAMINATION: Vitals:   06/15/21 0000 06/15/21 0348 06/15/21 0843 06/15/21 1047  BP:  119/75 (!) 135/91 134/90  Pulse:  (!) 106 (!) 107 92  Resp:  16 18 19   Temp: 99.4 F (37.4 C) 99 F (37.2 C) 98.6 F (37 C) 98.9 F (37.2 C)  TempSrc: Oral Oral Oral Oral  SpO2:  94% 98% 98%  Weight:  127.5 kg    Height:       General appearance: Awake alert.  In no distress Resp: Clear to auscultation bilaterally.  Normal effort Cardio: S1-S2 is normal regular.  No S3-S4.  No rubs murmurs or bruit GI: Abdomen is soft.  Nontender nondistended.  Bowel sounds are present normal.  No masses organomegaly Extremities: No edema.  Full range of motion of lower extremities. Neurologic: Alert and oriented x3.  No focal neurological deficits.    DISPOSITION: Home  Discharge Instructions     Call MD for:  difficulty breathing, headache or visual disturbances   Complete by: As directed    Call MD for:  extreme fatigue   Complete by: As directed    Call MD for:  persistant dizziness or light-headedness   Complete by: As directed     Call MD for:  persistant nausea and vomiting   Complete by: As directed    Call MD for:  severe uncontrolled pain   Complete by: As directed    Call MD for:  temperature >100.4   Complete by: As directed    Diet - low sodium heart healthy   Complete by: As directed    Discharge instructions   Complete by: As directed    Please take your medications as prescribed.  Please be sure to follow-up with your primary care provider in 7 to 10 days.  Seek attention if your symptoms recur.  You were cared for by a hospitalist during your hospital stay. If you have any questions about your discharge medications or the care you received while you were in the hospital after you are discharged, you can call the unit and asked to speak with  the hospitalist on call if the hospitalist that took care of you is not available. Once you are discharged, your primary care physician will handle any further medical issues. Please note that NO REFILLS for any discharge medications will be authorized once you are discharged, as it is imperative that you return to your primary care physician (or establish a relationship with a primary care physician if you do not have one) for your aftercare needs so that they can reassess your need for medications and monitor your lab values. If you do not have a primary care physician, you can call [phone] for a physician referral.   Increase activity slowly   Complete by: As directed    No wound care   Complete by: As directed           Allergies as of 06/15/2021       Reactions   Bee Venom Other (See Comments)   Unknown childhood reaction   Shellfish Allergy Hives, Swelling   Throat swelling        Medication List     TAKE these medications    acetaminophen 325 MG tablet Commonly known as: TYLENOL Take 650 mg by mouth every 6 (six) hours as needed for headache (pain).   aspirin-acetaminophen-caffeine 250-250-65 MG tablet Commonly known as: EXCEDRIN MIGRAINE Take  1-2 tablets by mouth every 6 (six) hours as needed for headache.   azithromycin 500 MG tablet Commonly known as: ZITHROMAX Take 1 tablet (500 mg total) by mouth daily for 2 days.   Blood Pressure Kit Devi 1 kit by Does not apply route once a week.   cefdinir 300 MG capsule Commonly known as: OMNICEF Take 1 capsule (300 mg total) by mouth every 12 (twelve) hours for 4 days.   furosemide 40 MG tablet Commonly known as: LASIX Take one tablet once daily for 5 days only.   oxyCODONE-acetaminophen 5-325 MG tablet Commonly known as: PERCOCET/ROXICET Take by mouth.   potassium chloride SA 20 MEQ tablet Commonly known as: KLOR-CON Take 1 tablet (20 mEq total) by mouth daily for 5 doses.           TOTAL DISCHARGE TIME: 35 minutes  [name] [name] [employer] on [email or web address]  06/16/2021, 1:37 PM

## 2021-06-15 NOTE — Progress Notes (Signed)
Discharge paperwork reviewed with patient. All questions answered. Medication regimen reviewed and understood. Patient to pick up meds from home pharmacy. PIV removed. Patient leaving by foot with family. All belongings taken with the patient.

## 2021-06-18 LAB — CULTURE, BLOOD (ROUTINE X 2)
Culture: NO GROWTH
Culture: NO GROWTH
Special Requests: ADEQUATE
Special Requests: ADEQUATE

## 2021-09-11 DIAGNOSIS — Z1329 Encounter for screening for other suspected endocrine disorder: Secondary | ICD-10-CM | POA: Diagnosis not present

## 2021-09-11 DIAGNOSIS — L700 Acne vulgaris: Secondary | ICD-10-CM | POA: Diagnosis not present

## 2021-09-11 DIAGNOSIS — Z6841 Body Mass Index (BMI) 40.0 and over, adult: Secondary | ICD-10-CM | POA: Diagnosis not present

## 2021-09-11 DIAGNOSIS — Z131 Encounter for screening for diabetes mellitus: Secondary | ICD-10-CM | POA: Diagnosis not present

## 2021-09-11 DIAGNOSIS — L309 Dermatitis, unspecified: Secondary | ICD-10-CM | POA: Diagnosis not present

## 2021-09-11 DIAGNOSIS — Z13220 Encounter for screening for lipoid disorders: Secondary | ICD-10-CM | POA: Diagnosis not present

## 2021-12-25 DIAGNOSIS — Z713 Dietary counseling and surveillance: Secondary | ICD-10-CM | POA: Diagnosis not present

## 2021-12-26 DIAGNOSIS — R635 Abnormal weight gain: Secondary | ICD-10-CM | POA: Diagnosis not present

## 2022-01-05 DIAGNOSIS — Z8659 Personal history of other mental and behavioral disorders: Secondary | ICD-10-CM | POA: Diagnosis not present

## 2022-01-05 DIAGNOSIS — F4320 Adjustment disorder, unspecified: Secondary | ICD-10-CM | POA: Diagnosis not present

## 2022-01-21 DIAGNOSIS — K222 Esophageal obstruction: Secondary | ICD-10-CM | POA: Diagnosis not present

## 2022-06-18 DIAGNOSIS — Z113 Encounter for screening for infections with a predominantly sexual mode of transmission: Secondary | ICD-10-CM | POA: Diagnosis not present

## 2022-06-18 DIAGNOSIS — A749 Chlamydial infection, unspecified: Secondary | ICD-10-CM | POA: Diagnosis not present

## 2022-06-18 DIAGNOSIS — R509 Fever, unspecified: Secondary | ICD-10-CM | POA: Diagnosis not present

## 2022-06-18 DIAGNOSIS — Z3202 Encounter for pregnancy test, result negative: Secondary | ICD-10-CM | POA: Diagnosis not present

## 2022-06-22 DIAGNOSIS — Z713 Dietary counseling and surveillance: Secondary | ICD-10-CM | POA: Diagnosis not present

## 2022-06-25 DIAGNOSIS — A549 Gonococcal infection, unspecified: Secondary | ICD-10-CM | POA: Diagnosis not present

## 2022-06-30 DIAGNOSIS — Z713 Dietary counseling and surveillance: Secondary | ICD-10-CM | POA: Diagnosis not present

## 2022-06-30 DIAGNOSIS — Z6841 Body Mass Index (BMI) 40.0 and over, adult: Secondary | ICD-10-CM | POA: Diagnosis not present

## 2022-07-02 DIAGNOSIS — F4320 Adjustment disorder, unspecified: Secondary | ICD-10-CM | POA: Diagnosis not present

## 2022-07-02 DIAGNOSIS — Z8659 Personal history of other mental and behavioral disorders: Secondary | ICD-10-CM | POA: Diagnosis not present

## 2022-07-09 DIAGNOSIS — Z113 Encounter for screening for infections with a predominantly sexual mode of transmission: Secondary | ICD-10-CM | POA: Diagnosis not present

## 2022-07-09 DIAGNOSIS — A549 Gonococcal infection, unspecified: Secondary | ICD-10-CM | POA: Diagnosis not present

## 2022-07-09 DIAGNOSIS — N926 Irregular menstruation, unspecified: Secondary | ICD-10-CM | POA: Diagnosis not present

## 2022-07-23 DIAGNOSIS — Z713 Dietary counseling and surveillance: Secondary | ICD-10-CM | POA: Diagnosis not present

## 2022-08-01 ENCOUNTER — Ambulatory Visit (HOSPITAL_COMMUNITY)
Admission: RE | Admit: 2022-08-01 | Discharge: 2022-08-01 | Disposition: A | Discharge status: Home / Self Care | Payer: Medicaid Other | Source: Ambulatory Visit | Attending: Urgent Care | Admitting: Urgent Care

## 2022-08-01 ENCOUNTER — Encounter (HOSPITAL_COMMUNITY): Payer: Self-pay

## 2022-08-01 VITALS — BP 103/85 | HR 79 | Temp 98.5°F | Resp 18

## 2022-08-01 DIAGNOSIS — D509 Iron deficiency anemia, unspecified: Secondary | ICD-10-CM | POA: Insufficient documentation

## 2022-08-01 DIAGNOSIS — Z8619 Personal history of other infectious and parasitic diseases: Secondary | ICD-10-CM | POA: Insufficient documentation

## 2022-08-01 DIAGNOSIS — L83 Acanthosis nigricans: Secondary | ICD-10-CM | POA: Diagnosis not present

## 2022-08-01 DIAGNOSIS — N939 Abnormal uterine and vaginal bleeding, unspecified: Secondary | ICD-10-CM | POA: Diagnosis not present

## 2022-08-01 LAB — CBC WITH DIFFERENTIAL/PLATELET
Abs Immature Granulocytes: 0.03 10*3/uL (ref 0.00–0.07)
Basophils Absolute: 0 10*3/uL (ref 0.0–0.1)
Basophils Relative: 1 %
Eosinophils Absolute: 0.3 10*3/uL (ref 0.0–0.5)
Eosinophils Relative: 4 %
HCT: 32.3 % — ABNORMAL LOW (ref 36.0–46.0)
Hemoglobin: 10.1 g/dL — ABNORMAL LOW (ref 12.0–15.0)
Immature Granulocytes: 1 %
Lymphocytes Relative: 28 %
Lymphs Abs: 1.7 10*3/uL (ref 0.7–4.0)
MCH: 24.4 pg — ABNORMAL LOW (ref 26.0–34.0)
MCHC: 31.3 g/dL (ref 30.0–36.0)
MCV: 78 fL — ABNORMAL LOW (ref 80.0–100.0)
Monocytes Absolute: 0.5 10*3/uL (ref 0.1–1.0)
Monocytes Relative: 9 %
Neutro Abs: 3.5 10*3/uL (ref 1.7–7.7)
Neutrophils Relative %: 57 %
Platelets: 308 10*3/uL (ref 150–400)
RBC: 4.14 MIL/uL (ref 3.87–5.11)
RDW: 17 % — ABNORMAL HIGH (ref 11.5–15.5)
WBC: 6.1 10*3/uL (ref 4.0–10.5)
nRBC: 0 % (ref 0.0–0.2)

## 2022-08-01 LAB — IRON AND TIBC
Iron: 35 ug/dL (ref 28–170)
Saturation Ratios: 8 % — ABNORMAL LOW (ref 10.4–31.8)
TIBC: 435 ug/dL (ref 250–450)
UIBC: 400 ug/dL

## 2022-08-01 NOTE — Discharge Instructions (Addendum)
You were swabbed today for gonorrhea, chlamydia, trichomonas, bacterial vaginosis, yeast. We will call with results of the swab if positive. We also checked a CBC and an iron on you. Given the acute occurrence of your vaginal bleeding, I would recommend a follow-up with your gynecologist to consider a pelvic ultrasound. You had a CRP and ESR lab drawn by her gynecologist 2 months ago, both of these labs were elevated.  I would recommend following up with them to have rechecked.

## 2022-08-01 NOTE — ED Triage Notes (Signed)
Patient with complaints of longer and heavier periods for the past 2 months.

## 2022-08-01 NOTE — ED Provider Notes (Signed)
[facility]    CSN: [id number] Arrival date & time: 08/01/22  1559      History   Chief Complaint Chief Complaint  Patient presents with   Vaginal Bleeding    Entered by patient    HPI [name] [name] is a 26 y.o. female.   26 year old female presents today due to concerns of vaginal bleeding.  She states has been present for the past 2 to 3 months.  She saw her OB/GYN for this on 06/18/2022.  She had a CBC, CMP, TSH, CRP, ESR drawn.  She also had a vaginal swab performed.  She was found to be positive for gonorrhea at that time, went back on 8/28 and had 1 g Rocephin.  She states prior to the past 3 months, she was always having a normal and regular menstrual period, every 30 days, which would last for roughly 3 days.  Patient states that she is currently on her menstrual cycle right now, which first started on September 28.  She states she is not passing clots and it is not abnormally heavy, but it is lasting for a very long time.  She would like to be rechecked with a vaginal swab to ensure the infection is gone.  She denies any pelvic pain, discharge.  She did have a tubal ligation during her C-section in August 2022, and states that ever since that she has been having a vague intermittent right sided pelvic discomfort.  Patient has been told in the past she has been anemic, and she is currently taking iron.  She is trying to have a weight loss surgery, and states the only thing preventing her is her anemia at this time.  Patient denies any additional complaints.   Vaginal Bleeding   Past Medical History:  Diagnosis Date   Gall bladder inflammation    GERD (gastroesophageal reflux disease)    Medical history non-contributory    Migraines     Patient Active Problem List   Diagnosis Date Noted   HCAP (healthcare-associated pneumonia) 06/13/2021   Respiratory failure with hypoxia ([place]) 06/13/2021   Normocytic anemia 06/13/2021   Acute CHF (congestive heart failure)  ([place]) 06/13/2021   VSD (ventricular septal defect) 01/15/2021   Rubella non-immune status, antepartum 11/16/2020   Obesity in pregnancy 11/16/2020   Morbid obesity with BMI of 50.0-59.9, adult ([place]) 11/16/2020   History of C-section 11/14/2020   Encounter for supervision of normal pregnancy in first trimester 10/14/2020    Past Surgical History:  Procedure Laterality Date   CESAREAN SECTION N/A 09/18/2015   Procedure: CESAREAN SECTION;  Surgeon: Molli Posey, MD;  Location: [facility];  Service: Obstetrics;  Laterality: N/A;   CESAREAN SECTION  06/05/2021   MOUTH SURGERY     NO PAST SURGERIES      OB History     Gravida  2   Para  1   Term  1   Preterm      AB      Living  1      SAB      IAB      Ectopic      Multiple  0   Live Births  1            Home Medications    Prior to Admission medications   Medication Sig Start Date End Date Taking? Authorizing Provider  Blood Pressure Monitoring (BLOOD PRESSURE KIT) DEVI 1 kit by Does not apply route once a week. 10/14/20  Constant, Peggy, MD    Family History Family History  Problem Relation Age of Onset   Diabetes Maternal Uncle    Hypertension Maternal Uncle    Heart disease Maternal Uncle     Social History Social History   Tobacco Use   Smoking status: Former    Types: Cigarettes, Cigars    Quit date: 01/14/2015    Years since quitting: 7.5   Smokeless tobacco: Never   Tobacco comments:    stopped after confirmed pregnancy  Vaping Use   Vaping Use: Never used  Substance Use Topics   Alcohol use: Not Currently    Comment: occ   Drug use: No     Allergies   Bee venom and Shellfish allergy   Review of Systems Review of Systems  Genitourinary:  Positive for vaginal bleeding.     Physical Exam Triage Vital Signs ED Triage Vitals [08/01/22 1617]  Enc Vitals Group     BP 103/85     Pulse Rate 79     Resp 18     Temp 98.5 F (36.9 C)     Temp Source Oral     SpO2 99 %      Weight      Height      Head Circumference      Peak Flow      Pain Score      Pain Loc      Pain Edu?      Excl. in [place]?    No data found.  Updated Vital Signs BP 103/85 (BP Location: Left Arm)   Pulse 79   Temp 98.5 F (36.9 C) (Oral)   Resp 18   LMP 07/23/2022 (Exact Date)   SpO2 99%   Visual Acuity Right Eye Distance:   Left Eye Distance:   Bilateral Distance:    Right Eye Near:   Left Eye Near:    Bilateral Near:     Physical Exam Vitals and nursing note reviewed.  Constitutional:      General: She is not in acute distress.    Appearance: Normal appearance. She is well-developed. She is obese. She is not ill-appearing, toxic-appearing or diaphoretic.  HENT:     Head: Normocephalic and atraumatic.  Eyes:     General: No scleral icterus.       Right eye: No discharge.        Left eye: No discharge.     Extraocular Movements: Extraocular movements intact.     Conjunctiva/sclera: Conjunctivae normal.     Pupils: Pupils are equal, round, and reactive to light.     Comments: Minimal conjunctival pallor  Cardiovascular:     Rate and Rhythm: Normal rate and regular rhythm.     Heart sounds: No murmur heard. Pulmonary:     Effort: Pulmonary effort is normal. No respiratory distress.     Breath sounds: Normal breath sounds.  Abdominal:     Palpations: Abdomen is soft.     Tenderness: There is no abdominal tenderness.  Musculoskeletal:        General: No swelling.     Cervical back: Neck supple.  Skin:    General: Skin is warm and dry.     Capillary Refill: Capillary refill takes less than 2 seconds.     Findings: Rash (acanthosis nigricans nape of neck) present.  Neurological:     Mental Status: She is alert.  Psychiatric:        Mood and Affect: Mood normal.  UC Treatments / Results  Labs (all labs ordered are listed, but only abnormal results are displayed) Labs Reviewed  CBC WITH DIFFERENTIAL/PLATELET  IRON AND TIBC  CERVICOVAGINAL ANCILLARY  ONLY    EKG   Radiology No results found.  Procedures Procedures (including critical care time)  Medications Ordered in UC Medications - No data to display  Initial Impression / Assessment and Plan / UC Course  I have reviewed the triage vital signs and the nursing notes.  Pertinent labs & imaging results that were available during my care of the patient were reviewed by me and considered in my medical decision making (see chart for details).     Abnormal vaginal bleeding -this has already been assessed by her OB less than 2 months ago.  I suspect patient may need an additional work-up including pelvic ultrasound and hormonal labs.  We did obtain a repeat vaginal swab today to ensure her STI was completely cleared. History of gonorrhea -patient had a Rocephin injection on August 28.  She denies any current symptoms.  Patient requesting a test of cure. Acanthosis nigricans -patient is in the process of trying to perform weight loss surgery.  This will likely resolve her insulin resistance.  Follow-up with PCP. Anemia -hemoglobin the end of August was 10.7.  Will recheck today including iron.   Final Clinical Impressions(s) / UC Diagnoses   Final diagnoses:  Abnormal vaginal bleeding  History of gonorrhea  Acanthosis nigricans     Discharge Instructions      You were swabbed today for gonorrhea, chlamydia, trichomonas, bacterial vaginosis, yeast. We will call with results of the swab if positive. We also checked a CBC and an iron on you. Given the acute occurrence of your vaginal bleeding, I would recommend a follow-up with your gynecologist to consider a pelvic ultrasound. You had a CRP and ESR lab drawn by her gynecologist 2 months ago, both of these labs were elevated.  I would recommend following up with them to have rechecked.    ED Prescriptions   None    PDMP not reviewed this encounter.   Chaney Malling, [place] 08/01/22 1731

## 2022-08-03 LAB — CERVICOVAGINAL ANCILLARY ONLY
Bacterial Vaginitis (gardnerella): POSITIVE — AB
Candida Glabrata: NEGATIVE
Candida Vaginitis: NEGATIVE
Chlamydia: NEGATIVE
Comment: NEGATIVE
Comment: NEGATIVE
Comment: NEGATIVE
Comment: NEGATIVE
Comment: NEGATIVE
Comment: NORMAL
Neisseria Gonorrhea: NEGATIVE
Trichomonas: NEGATIVE

## 2022-08-04 ENCOUNTER — Telehealth: Payer: Self-pay | Admitting: Emergency Medicine

## 2022-08-04 MED ORDER — METRONIDAZOLE 500 MG PO TABS: 500.0000 mg | ORAL_TABLET | Freq: Two times a day (BID) | ORAL | 0 refills | Status: DC

## 2022-10-11 ENCOUNTER — Encounter (HOSPITAL_COMMUNITY): Payer: Self-pay

## 2022-10-11 ENCOUNTER — Ambulatory Visit (HOSPITAL_COMMUNITY)
Admission: RE | Admit: 2022-10-11 | Discharge: 2022-10-11 | Disposition: A | Discharge status: Home / Self Care | Payer: BLUE CROSS/BLUE SHIELD | Source: Ambulatory Visit | Attending: Emergency Medicine | Admitting: Emergency Medicine

## 2022-10-11 VITALS — BP 143/72 | HR 89 | Temp 97.9°F | Resp 16

## 2022-10-11 DIAGNOSIS — Z113 Encounter for screening for infections with a predominantly sexual mode of transmission: Secondary | ICD-10-CM | POA: Insufficient documentation

## 2022-10-11 DIAGNOSIS — Z202 Contact with and (suspected) exposure to infections with a predominantly sexual mode of transmission: Secondary | ICD-10-CM | POA: Diagnosis not present

## 2022-10-11 LAB — HIV ANTIBODY (ROUTINE TESTING W REFLEX): HIV Screen 4th Generation wRfx: NONREACTIVE

## 2022-10-11 IMAGING — US US OB LIMITED
1 series · 4 of 4 positions shown · non-contrast
Comparison: none

[Series 1: us ob limited · 4 of 4 slices shown]
[im 1/4]
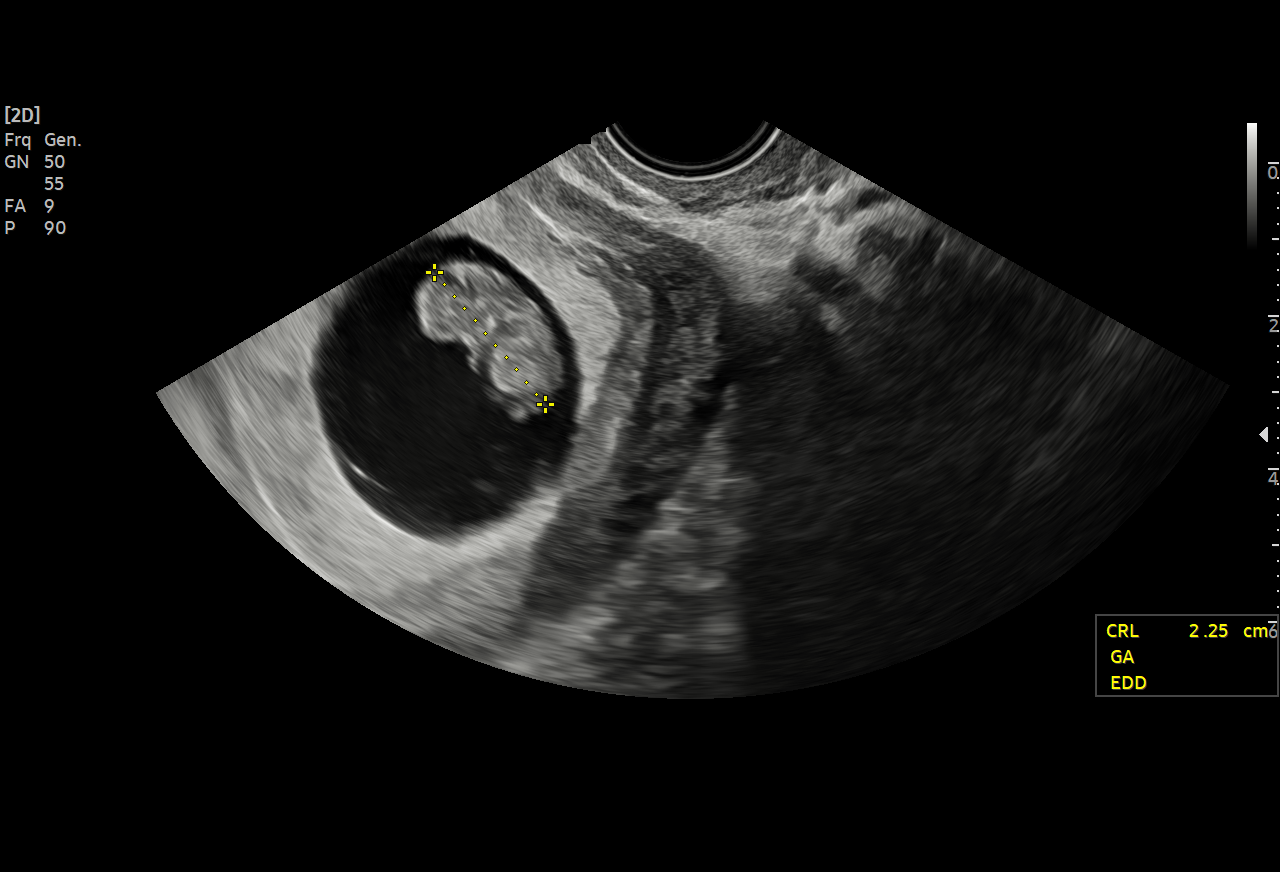
[im 2/4]
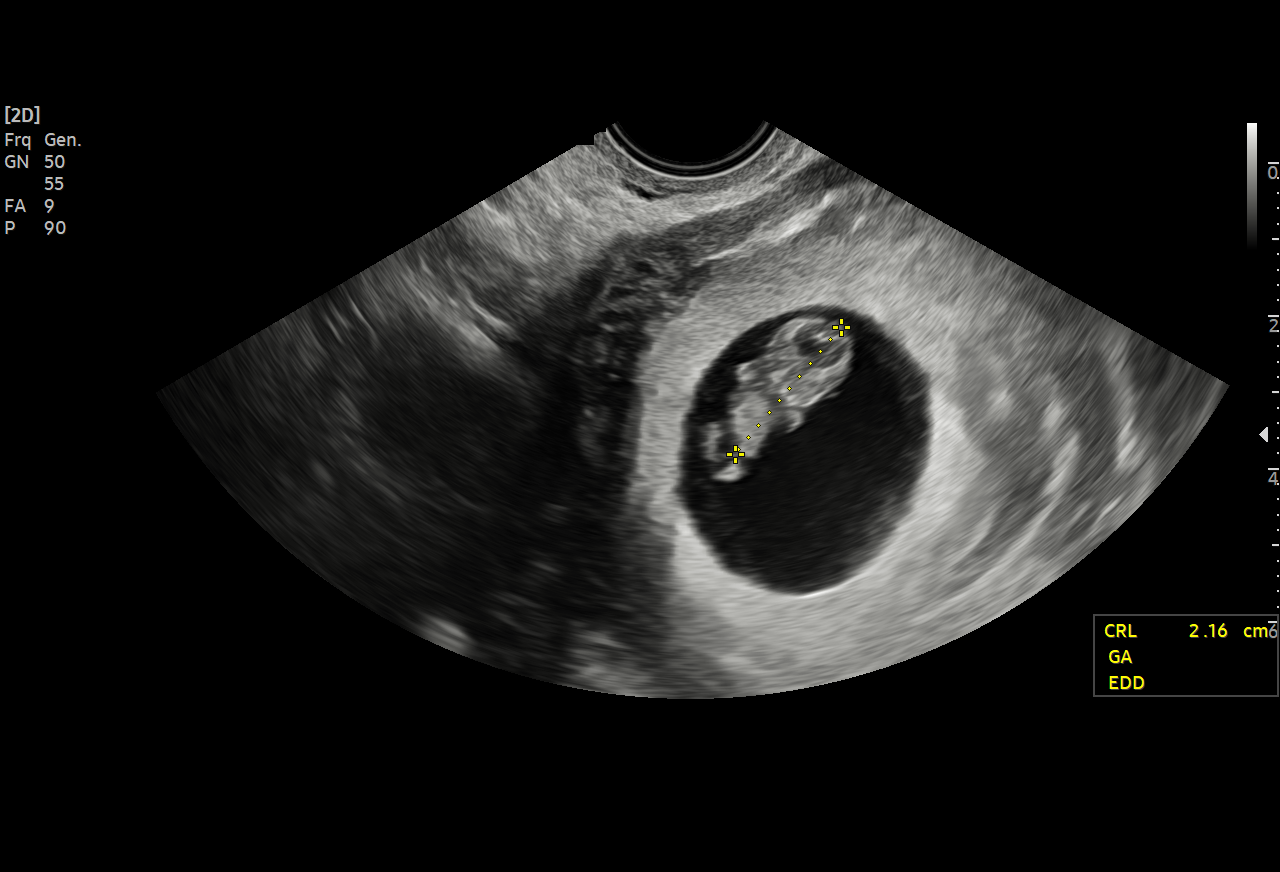
[im 3/4]
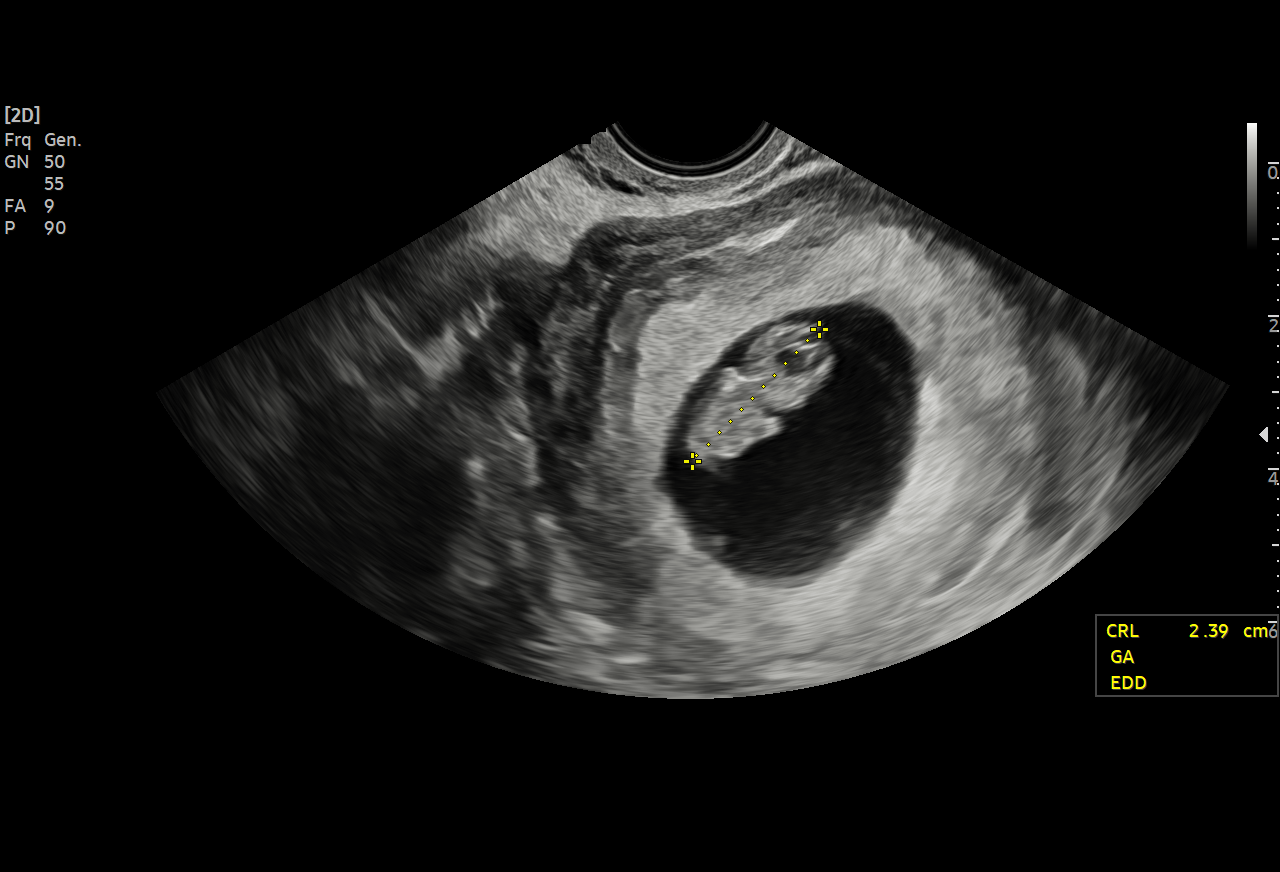
[im 4/4]
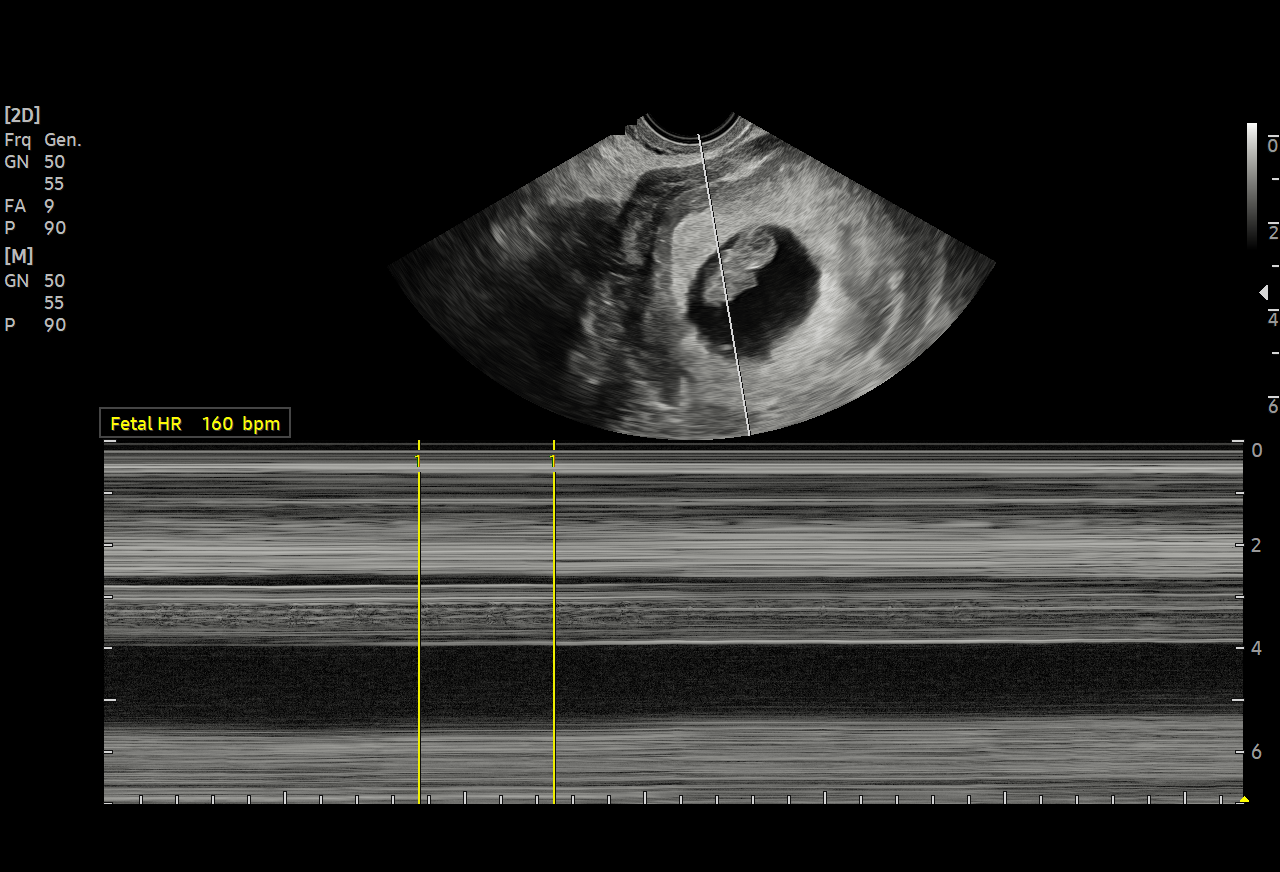

[4 of 4 positions shown; findings below may reference images not displayed]

Healthcare

 1   [HOSPITAL]                        [address].0      [facility]

Indications

 8 weeks gestation of pregnancy
Fetal Evaluation

 Num Of Fetuses:          1
 Fetal Heart Rate(bpm):   160
 Cardiac Activity:        Observed
Biometry

 CRL:      22.7   mm     G. Age:  8w 6d                    EDD:   06/13/21
Gestational Age

 Best:           8w 6d     Det. By:  U/S C R L (11/07/20)       EDD:  06/13/21
Comments

 Single live IUP at 8w6d by CRL. LMP 09/04/20
Impression

 Viable intrauterine pregnancy
Recommendations

 Routine prenatal care
                 Stier, Fed

## 2022-10-11 NOTE — ED Provider Notes (Signed)
[facility]    CSN: [id number] Arrival date & time: 10/11/22  1020      History   Chief Complaint Chief Complaint  Patient presents with   SEXUALLY TRANSMITTED DISEASE    Testing - Entered by patient    HPI [name] [name] is a 26 y.o. female.  Presents for STD testing Denies any symptoms or known exposures Has not had HIV/RPR testing in the past year  Past Medical History:  Diagnosis Date   Gall bladder inflammation    GERD (gastroesophageal reflux disease)    Medical history non-contributory    Migraines     Patient Active Problem List   Diagnosis Date Noted   HCAP (healthcare-associated pneumonia) 06/13/2021   Respiratory failure with hypoxia ([facility]) 06/13/2021   Normocytic anemia 06/13/2021   Acute CHF (congestive heart failure) ([facility]) 06/13/2021   VSD (ventricular septal defect) 01/15/2021   Rubella non-immune status, antepartum 11/16/2020   Obesity in pregnancy 11/16/2020   Morbid obesity with BMI of 50.0-59.9, adult ([facility]) 11/16/2020   History of C-section 11/14/2020   Encounter for supervision of normal pregnancy in first trimester 10/14/2020    Past Surgical History:  Procedure Laterality Date   CESAREAN SECTION N/A 09/18/2015   Procedure: CESAREAN SECTION;  Surgeon: Richard [name], MD;  Location: [facility];  Service: Obstetrics;  Laterality: N/A;   CESAREAN SECTION  06/05/2021   MOUTH SURGERY     NO PAST SURGERIES      OB History     Gravida  2   Para  1   Term  1   Preterm      AB      Living  1      SAB      IAB      Ectopic      Multiple  0   Live Births  1            Home Medications    Prior to Admission medications   Medication Sig Start Date End Date Taking? Authorizing Provider  Blood Pressure Monitoring (BLOOD PRESSURE KIT) DEVI 1 kit by Does not apply route once a week. 10/14/20   Constant, Peggy, MD    Family History Family History  Problem Relation Age of Onset   Diabetes Maternal Uncle     Hypertension Maternal Uncle    Heart disease Maternal Uncle     Social History Social History   Tobacco Use   Smoking status: Former    Types: Cigarettes, Cigars    Quit date: 01/14/2015    Years since quitting: 7.7   Smokeless tobacco: Never   Tobacco comments:    stopped after confirmed pregnancy  Vaping Use   Vaping Use: Never used  Substance Use Topics   Alcohol use: Not Currently    Comment: occ   Drug use: No     Allergies   Bee venom and Shellfish allergy   Review of Systems Review of Systems  Per HPI  Physical Exam Triage Vital Signs ED Triage Vitals  Enc Vitals Group     BP 10/11/22 1037 (!) 143/72     Pulse Rate 10/11/22 1037 89     Resp 10/11/22 1037 16     Temp 10/11/22 1037 97.9 F (36.6 C)     Temp Source 10/11/22 1037 Oral     SpO2 10/11/22 1037 98 %     Weight --      Height --      Head Circumference --        Peak Flow --      Pain Score 10/11/22 1038 0     Pain Loc --      Pain Edu? --      Excl. in [place]? --    No data found.  Updated Vital Signs BP (!) 143/72 (BP Location: Right Arm)   Pulse 89   Temp 97.9 F (36.6 C) (Oral)   Resp 16   LMP 09/23/2022 (Exact Date)   SpO2 98%    Physical Exam Vitals and nursing note reviewed.  Constitutional:      General: She is not in acute distress.    Appearance: Normal appearance.  HENT:     Mouth/Throat:     Pharynx: Oropharynx is clear.  Cardiovascular:     Rate and Rhythm: Normal rate and regular rhythm.     Pulses: Normal pulses.  Pulmonary:     Effort: Pulmonary effort is normal.  Neurological:     Mental Status: She is alert and oriented to person, place, and time.     UC Treatments / Results  Labs (all labs ordered are listed, but only abnormal results are displayed) Labs Reviewed  HIV ANTIBODY (ROUTINE TESTING W REFLEX)  RPR  CERVICOVAGINAL ANCILLARY ONLY    EKG  Radiology No results found.  Procedures Procedures (including critical care time)  Medications  Ordered in UC Medications - No data to display  Initial Impression / Assessment and Plan / UC Course  I have reviewed the triage vital signs and the nursing notes.  Pertinent labs & imaging results that were available during my care of the patient were reviewed by me and considered in my medical decision making (see chart for details).  Cytoswab, HIV/RPR pending No questions or concerns today Return precautions discussed. Patient agrees to plan  Final Clinical Impressions(s) / UC Diagnoses   Final diagnoses:  Screen for STD (sexually transmitted disease)     Discharge Instructions      We will call you if anything on your swab returns positive. Please abstain from sexual intercourse until your results return.     ED Prescriptions   None    PDMP not reviewed this encounter.   Rising, Rebecca, PA-C 10/11/22 1052  

## 2022-10-11 NOTE — Discharge Instructions (Signed)
We will call you if anything on your swab returns positive. Please abstain from sexual intercourse until your results return. 

## 2022-10-11 NOTE — ED Triage Notes (Signed)
Patient here for routine STD screening.

## 2022-10-12 LAB — CERVICOVAGINAL ANCILLARY ONLY
Bacterial Vaginitis (gardnerella): POSITIVE — AB
Candida Glabrata: NEGATIVE
Candida Vaginitis: NEGATIVE
Chlamydia: NEGATIVE
Comment: NEGATIVE
Comment: NEGATIVE
Comment: NEGATIVE
Comment: NEGATIVE
Comment: NEGATIVE
Comment: NORMAL
Neisseria Gonorrhea: NEGATIVE
Trichomonas: NEGATIVE

## 2022-10-12 LAB — RPR: RPR Ser Ql: NONREACTIVE

## 2022-10-13 ENCOUNTER — Telehealth (HOSPITAL_COMMUNITY): Payer: Self-pay | Admitting: Emergency Medicine

## 2022-10-13 MED ORDER — METRONIDAZOLE 500 MG PO TABS: 500.0000 mg | ORAL_TABLET | Freq: Two times a day (BID) | ORAL | 0 refills | Status: DC

## 2022-11-20 ENCOUNTER — Telehealth: Payer: Medicaid Other | Admitting: Physician Assistant

## 2022-11-20 DIAGNOSIS — B9689 Other specified bacterial agents as the cause of diseases classified elsewhere: Secondary | ICD-10-CM | POA: Diagnosis not present

## 2022-11-20 DIAGNOSIS — N760 Acute vaginitis: Secondary | ICD-10-CM | POA: Diagnosis not present

## 2022-11-20 MED ORDER — METRONIDAZOLE 500 MG PO TABS: 500.0000 mg | ORAL_TABLET | Freq: Two times a day (BID) | ORAL | 0 refills | Status: AC

## 2022-11-20 NOTE — Progress Notes (Signed)
E-Visit for Vaginal Symptoms  We are sorry that you are not feeling well. Here is how we plan to help! Based on what you shared with me it looks like you: May have a vaginosis due to bacteria  Vaginosis is an inflammation of the vagina that can result in discharge, itching and pain. The cause is usually a change in the normal balance of vaginal bacteria or an infection. Vaginosis can also result from reduced estrogen levels after menopause.  The most common causes of vaginosis are:   Bacterial vaginosis which results from an overgrowth of one on several organisms that are normally present in your vagina.   Yeast infections which are caused by a naturally occurring fungus called candida.   Vaginal atrophy (atrophic vaginosis) which results from the thinning of the vagina from reduced estrogen levels after menopause.   Trichomoniasis which is caused by a parasite and is commonly transmitted by sexual intercourse.  Factors that increase your risk of developing vaginosis include: Medications, such as antibiotics and steroids Uncontrolled diabetes Use of hygiene products such as bubble bath, vaginal spray or vaginal deodorant Douching Wearing damp or tight-fitting clothing Using an intrauterine device (IUD) for birth control Hormonal changes, such as those associated with pregnancy, birth control pills or menopause Sexual activity Having a sexually transmitted infection  Your treatment plan is Metronidazole or Flagyl 500mg twice a day for 7 days.  I have electronically sent this prescription into the pharmacy that you have chosen.  Be sure to take all of the medication as directed. Stop taking any medication if you develop a rash, tongue swelling or shortness of breath. Mothers who are breast feeding should consider pumping and discarding their breast milk while on these antibiotics. However, there is no consensus that infant exposure at these doses would be harmful.  Remember that  medication creams can weaken latex condoms. .   HOME CARE:  Good hygiene may prevent some types of vaginosis from recurring and may relieve some symptoms:  Avoid baths, hot tubs and whirlpool spas. Rinse soap from your outer genital area after a shower, and dry the area well to prevent irritation. Don't use scented or harsh soaps, such as those with deodorant or antibacterial action. Avoid irritants. These include scented tampons and pads. Wipe from front to back after using the toilet. Doing so avoids spreading fecal bacteria to your vagina.  Other things that may help prevent vaginosis include:  Don't douche. Your vagina doesn't require cleansing other than normal bathing. Repetitive douching disrupts the normal organisms that reside in the vagina and can actually increase your risk of vaginal infection. Douching won't clear up a vaginal infection. Use a latex condom. Both female and female latex condoms may help you avoid infections spread by sexual contact. Wear cotton underwear. Also wear pantyhose with a cotton crotch. If you feel comfortable without it, skip wearing underwear to bed. Yeast thrives in moist environments Your symptoms should improve in the next day or two.  GET HELP RIGHT AWAY IF:  You have pain in your lower abdomen ( pelvic area or over your ovaries) You develop nausea or vomiting You develop a fever Your discharge changes or worsens You have persistent pain with intercourse You develop shortness of breath, a rapid pulse, or you faint.  These symptoms could be signs of problems or infections that need to be evaluated by a medical provider now.  MAKE SURE YOU   Understand these instructions. Will watch your condition. Will get help right   away if you are not doing well or get worse.  Thank you for choosing an e-visit.  Your e-visit answers were reviewed by a board certified advanced clinical practitioner to complete your personal care plan. Depending upon the  condition, your plan could have included both over the counter or prescription medications.  Please review your pharmacy choice. Make sure the pharmacy is open so you can pick up prescription now. If there is a problem, you may contact your provider through [employer] and have the prescription routed to another pharmacy.  Your safety is important to [place]. If you have drug allergies check your prescription carefully.   For the next 24 hours you can use MyChart to ask questions about today's visit, request a non-urgent call back, or ask for a work or school excuse. You will get an email in the next two days asking about your experience. I hope that your e-visit has been valuable and will speed your recovery.   I have spent 5 minutes in review of e-visit questionnaire, review and updating patient chart, medical decision making and response to patient.   Lenise Arena Ward, PA-C

## 2023-01-28 ENCOUNTER — Ambulatory Visit (HOSPITAL_COMMUNITY)
Admission: RE | Admit: 2023-01-28 | Discharge: 2023-01-28 | Disposition: A | Discharge status: Home / Self Care | Payer: BLUE CROSS/BLUE SHIELD | Source: Ambulatory Visit | Attending: Internal Medicine | Admitting: Internal Medicine

## 2023-01-28 ENCOUNTER — Encounter (HOSPITAL_COMMUNITY): Payer: Self-pay

## 2023-01-28 VITALS — BP 138/95 | HR 98 | Temp 97.8°F | Resp 19

## 2023-01-28 DIAGNOSIS — Z113 Encounter for screening for infections with a predominantly sexual mode of transmission: Secondary | ICD-10-CM | POA: Diagnosis present

## 2023-01-28 LAB — HIV ANTIBODY (ROUTINE TESTING W REFLEX): HIV Screen 4th Generation wRfx: NONREACTIVE

## 2023-01-28 NOTE — ED Provider Notes (Signed)
[facility]    CSN: [id number] Arrival date & time: 01/28/23  1414      History   Chief Complaint Chief Complaint  Patient presents with   SEXUALLY TRANSMITTED DISEASE    Testing - Entered by patient    HPI [name] [name] is a 27 y.o. female who presents requesting STI testing. She denies having any symptoms. Has not had a new exposure. Her last STI test was 2 months and only had + results for BV. Had one STI in her life time which was GC last year.     Past Medical History:  Diagnosis Date   Gall bladder inflammation    GERD (gastroesophageal reflux disease)    Medical history non-contributory    Migraines     Patient Active Problem List   Diagnosis Date Noted   HCAP (healthcare-associated pneumonia) 06/13/2021   Respiratory failure with hypoxia 06/13/2021   Normocytic anemia 06/13/2021   Acute CHF (congestive heart failure) 06/13/2021   VSD (ventricular septal defect) 01/15/2021   Rubella non-immune status, antepartum 11/16/2020   Obesity in pregnancy 11/16/2020   Morbid obesity with BMI of 50.0-59.9, adult 11/16/2020   History of C-section 11/14/2020   Encounter for supervision of normal pregnancy in first trimester 10/14/2020    Past Surgical History:  Procedure Laterality Date   CESAREAN SECTION N/A 09/18/2015   Procedure: CESAREAN SECTION;  Surgeon: Molli Posey, MD;  Location: [facility];  Service: Obstetrics;  Laterality: N/A;   CESAREAN SECTION  06/05/2021   MOUTH SURGERY     NO PAST SURGERIES      OB History     Gravida  2   Para  1   Term  1   Preterm      AB      Living  1      SAB      IAB      Ectopic      Multiple  0   Live Births  1            Home Medications    Prior to Admission medications   Medication Sig Start Date End Date Taking? Authorizing Provider  Blood Pressure Monitoring (BLOOD PRESSURE KIT) DEVI 1 kit by Does not apply route once a week. 10/14/20   Constant, Peggy, MD    Family  History Family History  Problem Relation Age of Onset   Diabetes Maternal Uncle    Hypertension Maternal Uncle    Heart disease Maternal Uncle     Social History Social History   Tobacco Use   Smoking status: Former    Types: Cigarettes, Cigars    Quit date: 01/14/2015    Years since quitting: 8.0   Smokeless tobacco: Never   Tobacco comments:    stopped after confirmed pregnancy  Vaping Use   Vaping Use: Never used  Substance Use Topics   Alcohol use: Not Currently    Comment: occ   Drug use: No     Allergies   Bee venom and Shellfish allergy   Review of Systems Review of Systems  Genitourinary:  Negative for dysuria, frequency, pelvic pain, urgency, vaginal discharge and vaginal pain.     Physical Exam Triage Vital Signs ED Triage Vitals [01/28/23 1437]  Enc Vitals Group     BP (!) 138/95     Pulse Rate 98     Resp 19     Temp 97.8 F (36.6 C)     Temp Source Oral  SpO2 99 %     Weight      Height      Head Circumference      Peak Flow      Pain Score 0     Pain Loc      Pain Edu?      Excl. in [place]?    No data found.  Updated Vital Signs BP (!) 138/95 (BP Location: Right Arm)   Pulse 98   Temp 97.8 F (36.6 C) (Oral)   Resp 19   SpO2 99%   Visual Acuity Right Eye Distance:   Left Eye Distance:   Bilateral Distance:    Right Eye Near:   Left Eye Near:    Bilateral Near:     Physical Exam Vitals and nursing note reviewed.  Constitutional:      General: She is not in acute distress.    Appearance: She is obese. She is not toxic-appearing.  HENT:     Right Ear: External ear normal.     Left Ear: External ear normal.  Eyes:     Conjunctiva/sclera: Conjunctivae normal.  Pulmonary:     Effort: Pulmonary effort is normal.  Musculoskeletal:        General: Normal range of motion.     Cervical back: Neck supple.  Skin:    General: Skin is warm and dry.  Neurological:     Mental Status: She is alert and oriented to person, place,  and time.     Gait: Gait normal.  Psychiatric:        Mood and Affect: Mood normal.        Behavior: Behavior normal.        Thought Content: Thought content normal.        Judgment: Judgment normal.      UC Treatments / Results  Labs (all labs ordered are listed, but only abnormal results are displayed) Labs Reviewed  RPR  HIV ANTIBODY (ROUTINE TESTING W REFLEX)  CERVICOVAGINAL ANCILLARY ONLY    EKG   Radiology No results found.  Procedures Procedures (including critical care time)  Medications Ordered in UC Medications - No data to display  Initial Impression / Assessment and Plan / UC Course  I have reviewed the triage vital signs and the nursing notes. STI test ordered including HIV and RPR We will inform her if results are positive.  Final Clinical Impressions(s) / UC Diagnoses   Final diagnoses:  Screen for STD (sexually transmitted disease)     Discharge Instructions      We will call you if the results are positive    ED Prescriptions   None    PDMP not reviewed this encounter.   Shelby Mattocks, PA-C 01/28/23 1502

## 2023-01-28 NOTE — Discharge Instructions (Signed)
We will call you if the results are positive

## 2023-01-28 NOTE — ED Triage Notes (Signed)
Pt presents for STD testing. Denies any current symptoms at this time.  

## 2023-01-29 LAB — RPR: RPR Ser Ql: NONREACTIVE

## 2023-01-29 LAB — CERVICOVAGINAL ANCILLARY ONLY
Bacterial Vaginitis (gardnerella): POSITIVE — AB
Candida Glabrata: NEGATIVE
Candida Vaginitis: NEGATIVE
Chlamydia: NEGATIVE
Comment: NEGATIVE
Comment: NEGATIVE
Comment: NEGATIVE
Comment: NEGATIVE
Comment: NEGATIVE
Comment: NORMAL
Neisseria Gonorrhea: NEGATIVE
Trichomonas: NEGATIVE

## 2023-02-01 ENCOUNTER — Telehealth (HOSPITAL_COMMUNITY): Payer: Self-pay | Admitting: Emergency Medicine

## 2023-02-01 MED ORDER — METRONIDAZOLE 0.75 % VA GEL: 1.0000 | Freq: Every day | VAGINAL | 0 refills | Status: AC

## 2023-04-29 ENCOUNTER — Ambulatory Visit (HOSPITAL_COMMUNITY)
Admission: RE | Admit: 2023-04-29 | Discharge: 2023-04-29 | Disposition: A | Discharge status: Home / Self Care | Payer: BLUE CROSS/BLUE SHIELD | Source: Ambulatory Visit | Attending: Emergency Medicine | Admitting: Emergency Medicine

## 2023-04-29 ENCOUNTER — Encounter (HOSPITAL_COMMUNITY): Payer: Self-pay

## 2023-04-29 VITALS — BP 85/61 | HR 94 | Temp 98.3°F | Resp 18

## 2023-04-29 DIAGNOSIS — Z113 Encounter for screening for infections with a predominantly sexual mode of transmission: Secondary | ICD-10-CM | POA: Diagnosis not present

## 2023-04-29 LAB — POCT URINALYSIS DIP (MANUAL ENTRY)
Bilirubin, UA: NEGATIVE
Blood, UA: NEGATIVE
Glucose, UA: NEGATIVE mg/dL
Ketones, POC UA: NEGATIVE mg/dL
Leukocytes, UA: NEGATIVE
Nitrite, UA: NEGATIVE
Protein Ur, POC: NEGATIVE mg/dL
Spec Grav, UA: 1.01 (ref 1.010–1.025)
Urobilinogen, UA: 0.2 E.U./dL
pH, UA: 5.5 (ref 5.0–8.0)

## 2023-04-29 LAB — POCT URINE PREGNANCY: Preg Test, Ur: NEGATIVE

## 2023-04-29 LAB — HIV ANTIBODY (ROUTINE TESTING W REFLEX): HIV Screen 4th Generation wRfx: NONREACTIVE

## 2023-04-29 NOTE — ED Provider Notes (Signed)
[facility]    CSN: [id number] Arrival date & time: 04/29/23  0920    HISTORY   Chief Complaint  Patient presents with   SEXUALLY TRANSMITTED DISEASE    Testing - Entered by patient   HPI [name] [name] is a pleasant, 27 y.o. female who presents to urgent care today. Pt states she would like STI testing, denies known exposure. Patient denies burning with urination, increased frequency of urination, suprapubic pain, perineal pain, flank pain, fever, chills, malaise, rigors, significant fatigue, abnormal vaginal discharge, abnormal vaginal odor, vaginal itching, vaginal irritation, dyspareunia, and genital lesion(s).     The history is provided by the patient.   Past Medical History:  Diagnosis Date   Gall bladder inflammation    GERD (gastroesophageal reflux disease)    Medical history non-contributory    Migraines    Patient Active Problem List   Diagnosis Date Noted   HCAP (healthcare-associated pneumonia) 06/13/2021   Respiratory failure with hypoxia (HCC) 06/13/2021   Normocytic anemia 06/13/2021   Acute CHF (congestive heart failure) (HCC) 06/13/2021   VSD (ventricular septal defect) 01/15/2021   Rubella non-immune status, antepartum 11/16/2020   Obesity in pregnancy 11/16/2020   Morbid obesity with BMI of 50.0-59.9, adult (HCC) 11/16/2020   History of C-section 11/14/2020   Encounter for supervision of normal pregnancy in first trimester 10/14/2020   Past Surgical History:  Procedure Laterality Date   CESAREAN SECTION N/A 09/18/2015   Procedure: CESAREAN SECTION;  Surgeon: Richarda Overlie, MD;  Location: [facility];  Service: Obstetrics;  Laterality: N/A;   CESAREAN SECTION  06/05/2021   LAPAROSCOPIC GASTRIC BAND REMOVAL WITH LAPAROSCOPIC GASTRIC SLEEVE RESECTION     MOUTH SURGERY     NO PAST SURGERIES     OB History     Gravida  2   Para  1   Term  1   Preterm      AB      Living  1      SAB      IAB      Ectopic      Multiple   0   Live Births  1          Home Medications    Prior to Admission medications   Medication Sig Start Date End Date Taking? Authorizing Provider  Blood Pressure Monitoring (BLOOD PRESSURE KIT) DEVI 1 kit by Does not apply route once a week. 10/14/20   Constant, Peggy, MD    Family History Family History  Problem Relation Age of Onset   Diabetes Maternal Uncle    Hypertension Maternal Uncle    Heart disease Maternal Uncle    Social History Social History   Tobacco Use   Smoking status: Former    Types: Cigarettes, Cigars    Quit date: 01/14/2015    Years since quitting: 8.2   Smokeless tobacco: Never   Tobacco comments:    stopped after confirmed pregnancy  Vaping Use   Vaping Use: Never used  Substance Use Topics   Alcohol use: Yes    Comment: occ   Drug use: No   Allergies   Bee venom and Shellfish allergy  Review of Systems Review of Systems Pertinent findings revealed after performing a 14 point review of systems has been noted in the history of present illness.  Physical Exam Vital Signs BP (!) 85/61 (BP Location: Left Arm)   Pulse 94   Temp 98.3 F (36.8 C) (Oral)   Resp 18  LMP 04/05/2023 (Exact Date)   SpO2 97%   No data found.  Physical Exam Vitals and nursing note reviewed.  Constitutional:      General: She is not in acute distress.    Appearance: Normal appearance. She is not ill-appearing.  HENT:     Head: Normocephalic and atraumatic.  Eyes:     General: Lids are normal.        Right eye: No discharge.        Left eye: No discharge.     Extraocular Movements: Extraocular movements intact.     Conjunctiva/sclera: Conjunctivae normal.     Right eye: Right conjunctiva is not injected.     Left eye: Left conjunctiva is not injected.  Neck:     Trachea: Trachea and phonation normal.  Cardiovascular:     Rate and Rhythm: Normal rate and regular rhythm.     Pulses: Normal pulses.     Heart sounds: Normal heart sounds. No murmur  heard.    No friction rub. No gallop.  Pulmonary:     Effort: Pulmonary effort is normal. No accessory muscle usage, prolonged expiration or respiratory distress.     Breath sounds: Normal breath sounds. No stridor, decreased air movement or transmitted upper airway sounds. No decreased breath sounds, wheezing, rhonchi or rales.  Chest:     Chest wall: No tenderness.  Genitourinary:    Comments: Patient politely declines pelvic exam today, patient provided a vaginal swab for testing. Musculoskeletal:        General: Normal range of motion.     Cervical back: Normal range of motion and neck supple. Normal range of motion.  Lymphadenopathy:     Cervical: No cervical adenopathy.  Skin:    General: Skin is warm and dry.     Findings: No erythema or rash.  Neurological:     General: No focal deficit present.     Mental Status: She is alert and oriented to person, place, and time.  Psychiatric:        Mood and Affect: Mood normal.        Behavior: Behavior normal.     Visual Acuity Right Eye Distance:   Left Eye Distance:   Bilateral Distance:    Right Eye Near:   Left Eye Near:    Bilateral Near:     UC Couse / Diagnostics / Procedures:     Radiology No results found.  Procedures Procedures (including critical care time) EKG  Pending results:  Labs Reviewed  RPR  HIV ANTIBODY (ROUTINE TESTING W REFLEX)  POCT URINALYSIS DIP (MANUAL ENTRY)  POCT URINE PREGNANCY  CERVICOVAGINAL ANCILLARY ONLY    Medications Ordered in UC: Medications - No data to display  UC Diagnoses / Final Clinical Impressions(s)   I have reviewed the triage vital signs and the nursing notes.  Pertinent labs & imaging results that were available during my care of the patient were reviewed by me and considered in my medical decision making (see chart for details).    Final diagnoses:  Screening examination for STD (sexually transmitted disease)   STD screening was performed, patient advised  that the results be posted to their MyChart and if any of the results are positive, they will be notified by phone, further treatment will be provided as indicated based on results of STD screening. Patient was advised to abstain from sexual intercourse until that they receive the results of their STD testing.  Patient was also advised to use condoms  to protect themselves from STD exposure. Urinalysis today was normal. Urine pregnancy test was negative. Return precautions advised.  Drug allergies reviewed, all questions addressed.   Please see discharge instructions below for details of plan of care as provided to patient. ED Prescriptions   None    PDMP not reviewed this encounter.  Disposition Upon Discharge:  Condition: stable for discharge home  Patient presented with concern for an acute illness with associated systemic symptoms and significant discomfort requiring urgent management. In my opinion, this is a condition that a prudent lay person (someone who possesses an average knowledge of health and medicine) may potentially expect to result in complications if not addressed urgently such as respiratory distress, impairment of bodily function or dysfunction of bodily organs.   As such, the patient has been evaluated and assessed, work-up was performed and treatment was provided in alignment with urgent care protocols and evidence based medicine.  Patient/parent/caregiver has been advised that the patient may require follow up for further testing and/or treatment if the symptoms continue in spite of treatment, as clinically indicated and appropriate.  Routine symptom specific, illness specific and/or disease specific instructions were discussed with the patient and/or caregiver at length.  Prevention strategies for avoiding STD exposure were also discussed.  The patient will follow up with their current PCP if and as advised. If the patient does not currently have a PCP we will assist them  in obtaining one.   The patient may need specialty follow up if the symptoms continue, in spite of conservative treatment and management, for further workup, evaluation, consultation and treatment as clinically indicated and appropriate.  Patient/parent/caregiver verbalized understanding and agreement of plan as discussed.  All questions were addressed during visit.  Please see discharge instructions below for further details of plan.  Discharge Instructions:   Discharge Instructions      The results of your vaginal swab test which screens for BV, yeast, gonorrhea, chlamydia and trichomonas will be made posted to your MyChart account once it is complete.  This typically takes 2 to 4 days.  Please abstain from sexual intercourse of any kind, vaginal, oral or anal, until you have received the results of your STD testing.     The results of your HIV and syphilis blood tests will be made available to you once they are complete.  They will initially be posted to your MyChart account which typically takes 2 to 3 days.     If any of your results are abnormal, you will receive a phone call regarding treatment.  Prescriptions, if any are needed, will be provided for you at your pharmacy.     Your urine pregnancy test today is negative.   Our point-of-care analysis of your urine sample today was normal and did not reveal any concern for urinary tract infection.   Thank you for visiting urgent care today.  I appreciate the opportunity to participate in your care.       This office note has been dictated using Teaching laboratory technician.  Unfortunately, this method of dictation can sometimes lead to typographical or grammatical errors.  I apologize for your inconvenience in advance if this occurs.  Please do not hesitate to reach out to me if clarification is needed.       Theadora Rama Scales, [place] 04/29/23 [phone]

## 2023-04-29 NOTE — ED Triage Notes (Signed)
Pt states she would like STI testing, denies any sx or exposures.

## 2023-04-29 NOTE — Discharge Instructions (Addendum)
The results of your vaginal swab test which screens for BV, yeast, gonorrhea, chlamydia and trichomonas will be made posted to your MyChart account once it is complete.  This typically takes 2 to 4 days.  Please abstain from sexual intercourse of any kind, vaginal, oral or anal, until you have received the results of your STD testing.     The results of your HIV and syphilis blood tests will be made available to you once they are complete.  They will initially be posted to your MyChart account which typically takes 2 to 3 days.     If any of your results are abnormal, you will receive a phone call regarding treatment.  Prescriptions, if any are needed, will be provided for you at your pharmacy.     Your urine pregnancy test today is negative.   Our point-of-care analysis of your urine sample today was normal and did not reveal any concern for urinary tract infection.   Thank you for visiting urgent care today.  I appreciate the opportunity to participate in your care.  

## 2023-04-30 LAB — CERVICOVAGINAL ANCILLARY ONLY
Bacterial Vaginitis (gardnerella): POSITIVE — AB
Candida Glabrata: NEGATIVE
Candida Vaginitis: NEGATIVE
Chlamydia: NEGATIVE
Comment: NEGATIVE
Comment: NEGATIVE
Comment: NEGATIVE
Comment: NEGATIVE
Comment: NEGATIVE
Comment: NORMAL
Neisseria Gonorrhea: NEGATIVE
Trichomonas: NEGATIVE

## 2023-04-30 LAB — RPR: RPR Ser Ql: NONREACTIVE

## 2023-05-01 ENCOUNTER — Telehealth: Payer: Self-pay

## 2023-05-01 MED ORDER — METRONIDAZOLE 0.75 % VA GEL: 1.0000 | Freq: Every day | VAGINAL | Status: AC

## 2023-05-01 NOTE — Telephone Encounter (Signed)
Per protocol pt requires tx with Flagyl. Reviewed with patient, verified pharmacy, prescription sent

## 2023-05-17 IMAGING — CT CT ANGIO CHEST
2 of 7 series · 17 of 46 positions shown · IV contrast (omnipaque)
Comparison: None.

CLINICAL DATA: Acute shortness of breath, headache, 8 days
postpartum

EXAM:
CT ANGIOGRAPHY CHEST WITH CONTRAST
TECHNIQUE: Multidetector CT imaging of the chest was performed using the
standard protocol during bolus administration of intravenous
contrast. Multiplanar CT image reconstructions and MIPs were
obtained to evaluate the vascular anatomy.
CONTRAST:  80mL OMNIPAQUE IOHEXOL 350 MG/ML SOLN

[Series 5: pe axial thins · axial · 0.69mm/px · z∈[+1134,+1358]mm · 14 of 260 slices shown]
[im 18/260  lung]
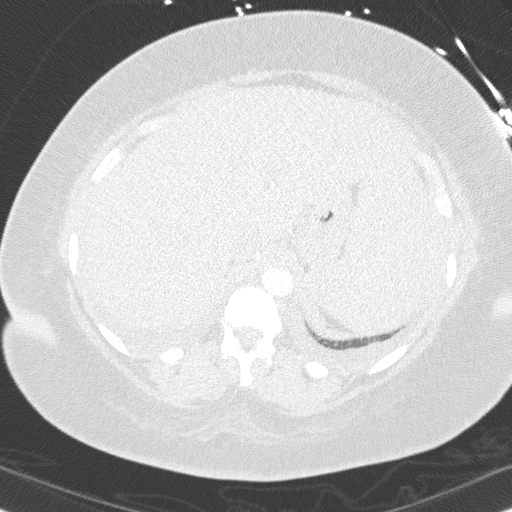
[im 35/260  soft-tissue]
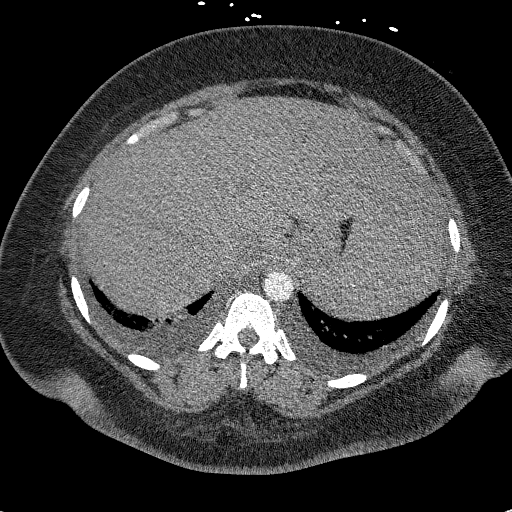
[im 52/260  lung]
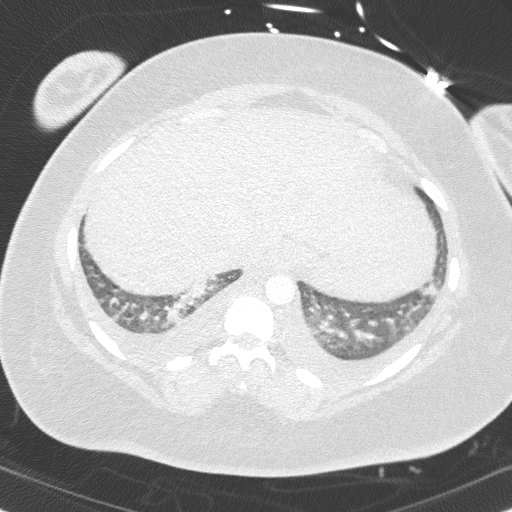
[im 70/260  soft-tissue]
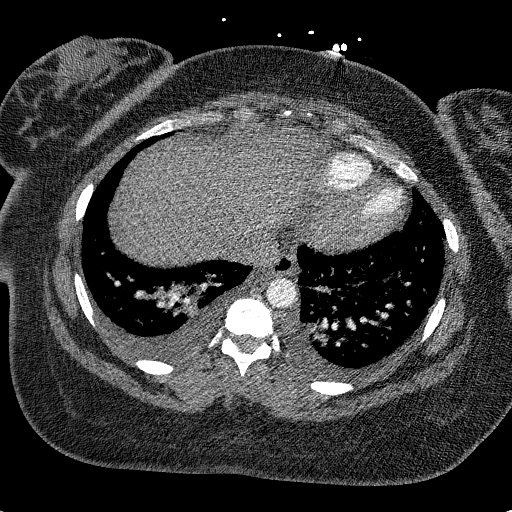
[im 87/260  lung]
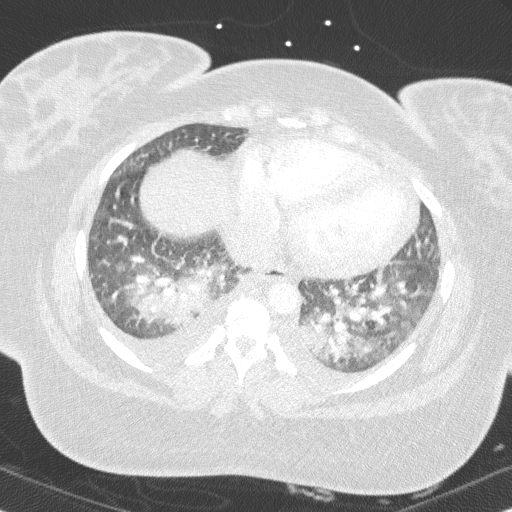
[im 104/260  soft-tissue]
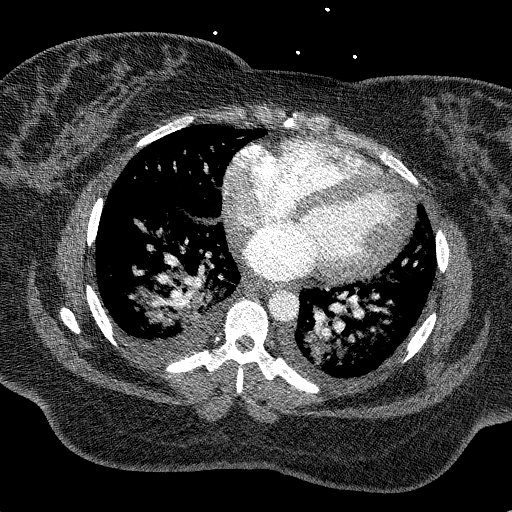
[im 121/260  lung]
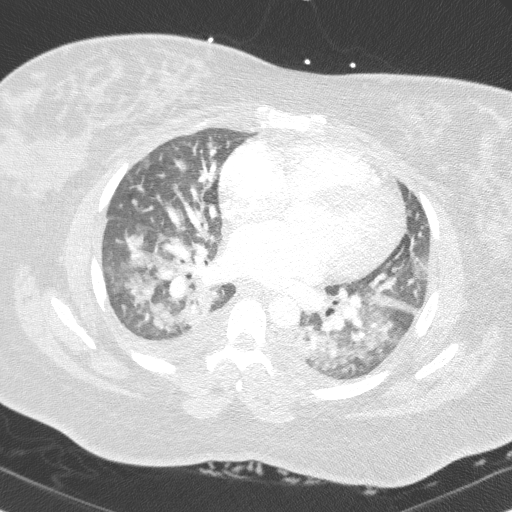
[im 139/260  soft-tissue]
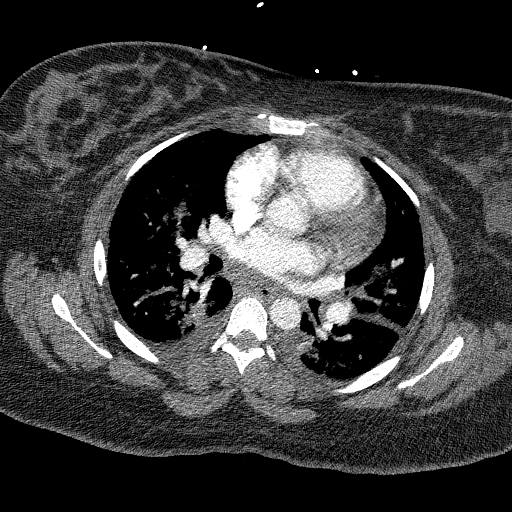
[im 156/260  lung]
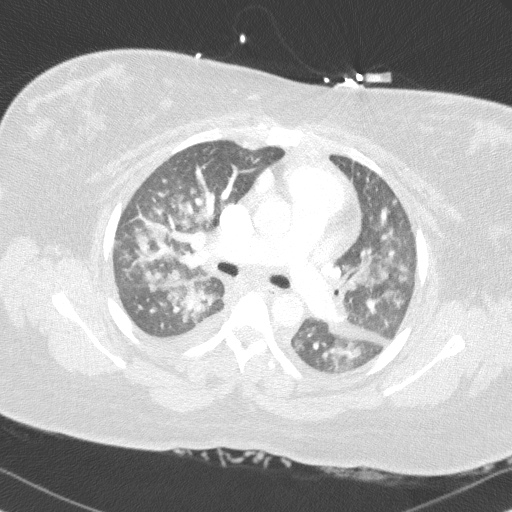
[im 173/260  soft-tissue]
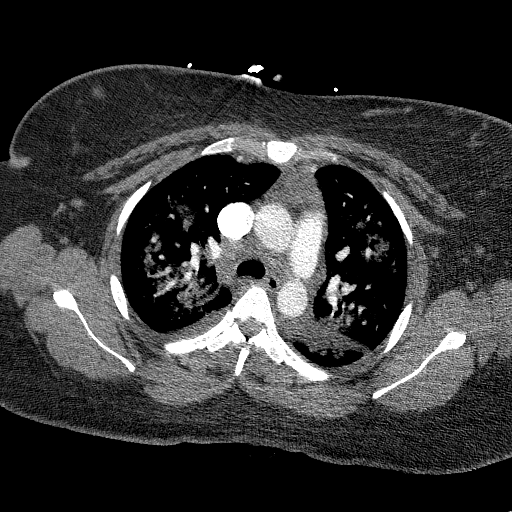
[im 190/260  lung]
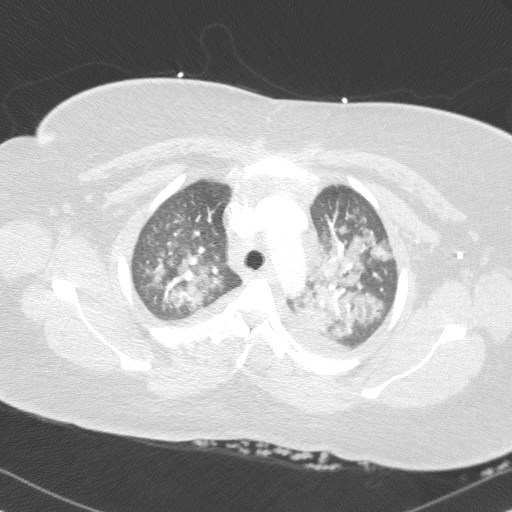
[im 208/260  soft-tissue]
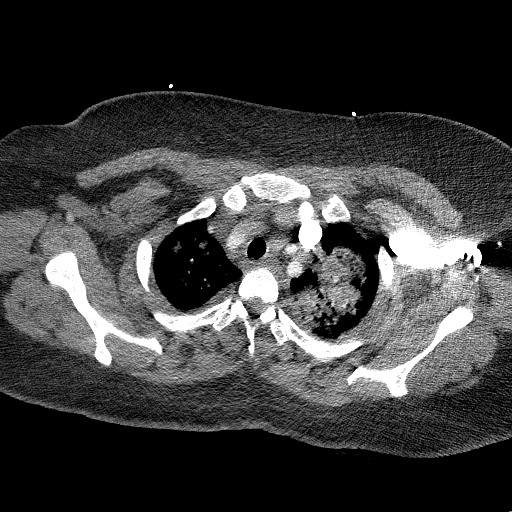
[im 225/260  lung]
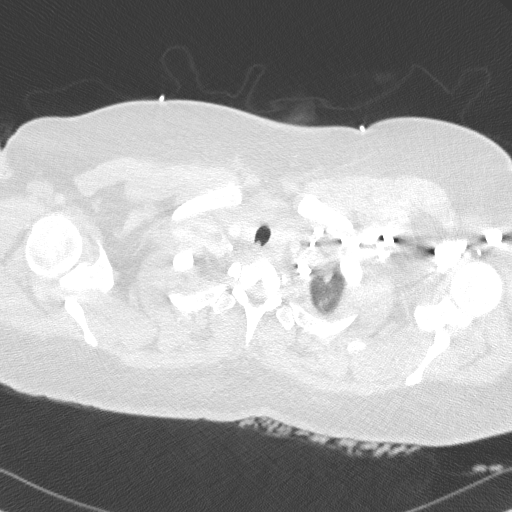
[im 242/260  soft-tissue]
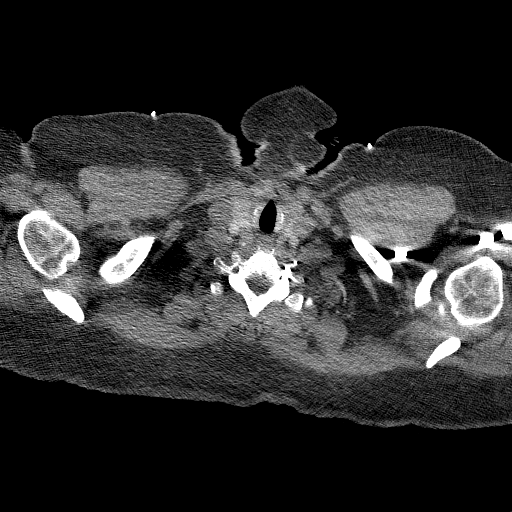

[Series 7: cor soft · coronal · 0.55mm/px · 3 of 128 slices shown]
[im 32/128  soft-tissue]
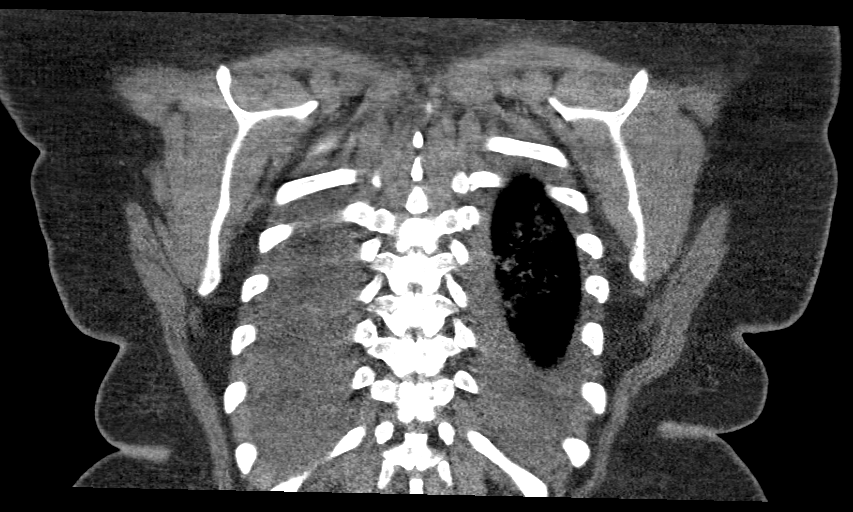
[im 64/128  soft-tissue]
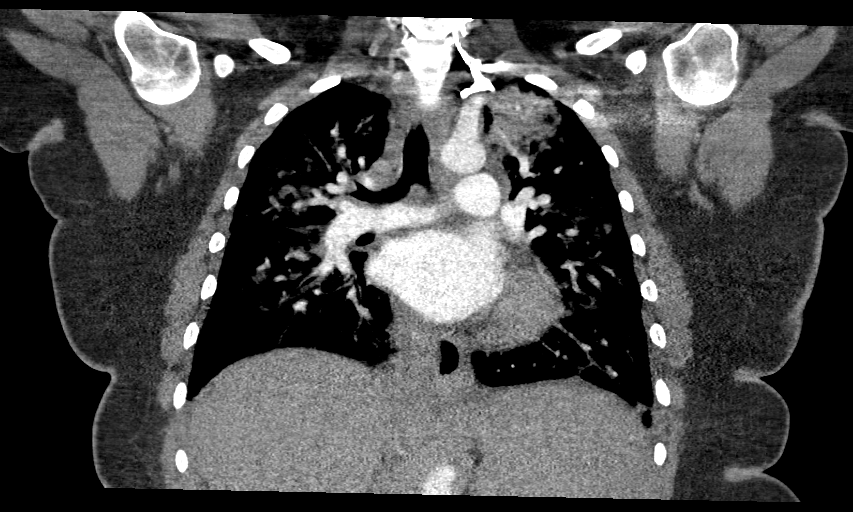
[im 96/128  soft-tissue]
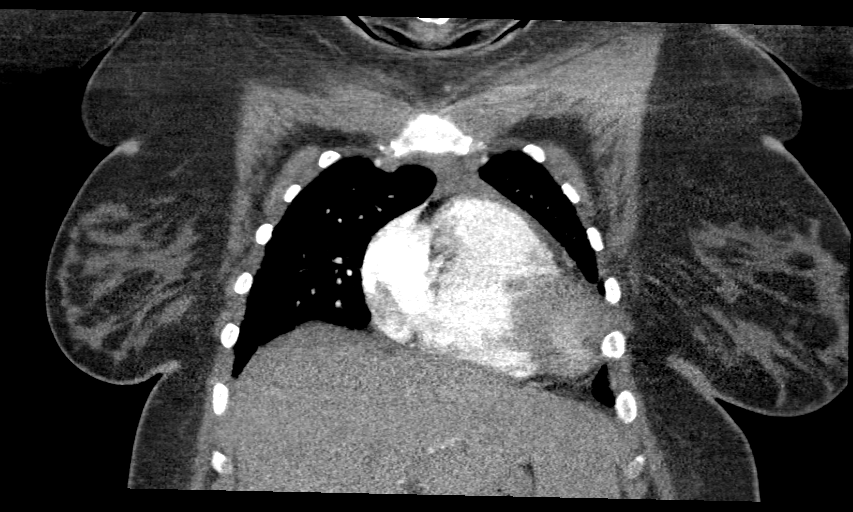

[17 of 46 positions shown; findings below may reference images not displayed]

FINDINGS: Cardiovascular: Pulmonary arteries are patent and normal in caliber.
Negative for significant acute pulmonary embolus by CTA.

Intact thoracic aorta. Negative for aneurysm or dissection. Normal
heart size. No pericardial effusion.

Central venous structures appear patent.  No Haein process.

Mediastinum/Nodes: Mildly enlarged thyroid with slight substernal
extension. Trachea and central airways are patent. Esophagus
nondilated. No adenopathy. Residual thymic triangular soft tissue
suspected in the anterior mediastinum.

Lungs/Pleura: Consolidative patchy peribronchovascular airspace
opacities throughout all lobes of both lungs with a more central
pattern of distribution. Airspace process worse in the lower lobes
and the lung apices. Appearance compatible with extensive bilateral
pneumonia or acute alveolar edema. Minor subsegmental bibasilar
atelectasis. Symmetric small pleural effusions. No pneumothorax.

Upper Abdomen: No acute abnormality.

Musculoskeletal: No chest wall abnormality. No acute or significant
osseous findings.

Review of the MIP images confirms the above findings.
IMPRESSION: Negative for significant acute pulmonary embolus.

Extensive multilobar patchy consolidative airspace process with a
central pattern of distribution favored to be multifocal pneumonia
versus acute alveolar edema.

Associated bilateral small non loculated pleural effusions.

## 2023-10-14 ENCOUNTER — Ambulatory Visit
Admission: RE | Admit: 2023-10-14 | Discharge: 2023-10-14 | Disposition: A | Discharge status: Home / Self Care | Payer: BLUE CROSS/BLUE SHIELD | Source: Ambulatory Visit | Attending: Internal Medicine | Admitting: Internal Medicine

## 2023-10-14 VITALS — BP 118/81 | HR 72 | Temp 98.0°F | Resp 16

## 2023-10-14 DIAGNOSIS — Z113 Encounter for screening for infections with a predominantly sexual mode of transmission: Secondary | ICD-10-CM | POA: Diagnosis present

## 2023-10-14 DIAGNOSIS — H00014 Hordeolum externum left upper eyelid: Secondary | ICD-10-CM

## 2023-10-14 NOTE — Discharge Instructions (Signed)
Warm compresses to the eye as needed. The clinic will contact you with the results of the testing done today if positive.

## 2023-10-14 NOTE — ED Provider Notes (Signed)
[facility]    CSN: [id number] Arrival date & time: 10/14/23  0856      History   Chief Complaint Chief Complaint  Patient presents with   SEXUALLY TRANSMITTED DISEASE    Entered by patient    HPI [name] [name] is a 27 y.o. female presents for STD screening and eyelid swelling.  Patient reports 1 day of left upper eyelid swelling/tenderness.  No redness, erythema, drainage.  Denies any redness or drainage from the eye itself.  No injury.  No new contacts such as make-ups or lotions.  In addition she would like STD screening.  Denies any symptoms including dysuria, vaginal discharge, fevers, nausea/vomiting, flank pain.  No known exposure.  No other concerns at this time.  HPI  Past Medical History:  Diagnosis Date   Gall bladder inflammation    GERD (gastroesophageal reflux disease)    Medical history non-contributory    Migraines     Patient Active Problem List   Diagnosis Date Noted   HCAP (healthcare-associated pneumonia) 06/13/2021   Respiratory failure with hypoxia (HCC) 06/13/2021   Normocytic anemia 06/13/2021   Acute CHF (congestive heart failure) (HCC) 06/13/2021   VSD (ventricular septal defect) 01/15/2021   Rubella non-immune status, antepartum 11/16/2020   Obesity in pregnancy 11/16/2020   Morbid obesity with BMI of 50.0-59.9, adult (HCC) 11/16/2020   History of C-section 11/14/2020   Encounter for supervision of normal pregnancy in first trimester 10/14/2020    Past Surgical History:  Procedure Laterality Date   CESAREAN SECTION N/A 09/18/2015   Procedure: CESAREAN SECTION;  Surgeon: Richarda Overlie, MD;  Location: [facility];  Service: Obstetrics;  Laterality: N/A;   CESAREAN SECTION  06/05/2021   LAPAROSCOPIC GASTRIC BAND REMOVAL WITH LAPAROSCOPIC GASTRIC SLEEVE RESECTION     MOUTH SURGERY     NO PAST SURGERIES      OB History     Gravida  2   Para  1   Term  1   Preterm      AB      Living  1      SAB      IAB       Ectopic      Multiple  0   Live Births  1            Home Medications    Prior to Admission medications   Medication Sig Start Date End Date Taking? Authorizing Provider  FLUoxetine (PROZAC) 10 MG capsule Take 10 mg by mouth every morning. 06/24/23  Yes [provider]  ursodiol (ACTIGALL) 300 MG capsule Take by mouth. 12/22/22  Yes [provider]  Blood Pressure Monitoring (BLOOD PRESSURE KIT) DEVI 1 kit by Does not apply route once a week. 10/14/20   Constant, Peggy, MD  FEROSUL 325 (65 Fe) MG tablet Take 325 mg by mouth daily.   Yes [provider]  hydrOXYzine (ATARAX) 10 MG tablet Take by mouth.   Yes [provider]  lamoTRIgine (LAMICTAL) 100 MG tablet Take 100 mg by mouth daily. 03/08/23  Yes [provider]    Family History Family History  Problem Relation Age of Onset   Diabetes Maternal Uncle    Hypertension Maternal Uncle    Heart disease Maternal Uncle     Social History Social History   Tobacco Use   Smoking status: Former    Current packs/day: 0.00    Types: Cigarettes, Cigars    Quit date: 01/14/2015  Years since quitting: 8.7   Smokeless tobacco: Never   Tobacco comments:    stopped after confirmed pregnancy  Vaping Use   Vaping status: Never Used  Substance Use Topics   Alcohol use: Yes    Comment: occ   Drug use: No     Allergies   Bee venom and Shellfish allergy   Review of Systems Review of Systems  Eyes:        Left upper eyelid swelling  Genitourinary:        STD screening     Physical Exam Triage Vital Signs ED Triage Vitals  Encounter Vitals Group     BP 10/14/23 0908 118/81     Systolic BP Percentile --      Diastolic BP Percentile --      Pulse Rate 10/14/23 0908 72     Resp 10/14/23 0908 16     Temp 10/14/23 0908 98 F (36.7 C)     Temp Source 10/14/23 0908 Oral     SpO2 10/14/23 0908 98 %     Weight --      Height --      Head Circumference --      Peak Flow --       Pain Score 10/14/23 0904 0     Pain Loc --      Pain Education --      Exclude from Growth Chart --    No data found.  Updated Vital Signs BP 118/81 (BP Location: Right Arm)   Pulse 72   Temp 98 F (36.7 C) (Oral)   Resp 16   LMP 10/09/2023 (Approximate)   SpO2 98%   Visual Acuity Right Eye Distance:   Left Eye Distance:   Bilateral Distance:    Right Eye Near:   Left Eye Near:    Bilateral Near:     Physical Exam Vitals and nursing note reviewed.  Constitutional:      Appearance: Normal appearance.  HENT:     Head: Normocephalic and atraumatic.  Eyes:     General:        Left eye: Hordeolum present.    Pupils: Pupils are equal, round, and reactive to light.      Comments: There is very minimal swelling to the mid left upper eyelid consistent with stye.  No drainage.  No periorbital swelling.  Cardiovascular:     Rate and Rhythm: Normal rate.  Pulmonary:     Effort: Pulmonary effort is normal.  Abdominal:     Tenderness: There is no right CVA tenderness or left CVA tenderness.  Skin:    General: Skin is warm and dry.  Neurological:     General: No focal deficit present.     Mental Status: She is alert and oriented to person, place, and time.  Psychiatric:        Mood and Affect: Mood normal.        Behavior: Behavior normal.      UC Treatments / Results  Labs (all labs ordered are listed, but only abnormal results are displayed) Labs Reviewed  RPR  HIV ANTIBODY (ROUTINE TESTING W REFLEX)  CERVICOVAGINAL ANCILLARY ONLY    EKG   Radiology No results found.  Procedures Procedures (including critical care time)  Medications Ordered in UC Medications - No data to display  Initial Impression / Assessment and Plan / UC Course  I have reviewed the triage vital signs and the nursing notes.  Pertinent labs & imaging results  that were available during my care of the patient were reviewed by me and considered in my medical decision making (see  chart for details).     Reviewed exam and symptoms with patient.  No red flags.  STD testing is ordered we will contact for any positive results.  Discussed stye to left upper eyelid.  Advised warm compresses.  PCP follow-up as symptoms do not improve.  ER precautions reviewed. Final Clinical Impressions(s) / UC Diagnoses   Final diagnoses:  Screening examination for STD (sexually transmitted disease)  Hordeolum externum of left upper eyelid     Discharge Instructions      Warm compresses to the eye as needed. The clinic will contact you with the results of the testing done today if positive.     ED Prescriptions   None    PDMP not reviewed this encounter.   Radford Pax, NP 10/14/23 [phone]

## 2023-10-14 NOTE — ED Triage Notes (Signed)
Pt presents to UC for std testing. Denies symptoms.  Pt reports left eyelid swelling since yesterday. Denies drainage.

## 2023-10-15 LAB — CERVICOVAGINAL ANCILLARY ONLY
Chlamydia: NEGATIVE
Comment: NEGATIVE
Comment: NEGATIVE
Comment: NORMAL
Neisseria Gonorrhea: NEGATIVE
Trichomonas: NEGATIVE

## 2023-10-15 LAB — HIV ANTIBODY (ROUTINE TESTING W REFLEX): HIV Screen 4th Generation wRfx: NONREACTIVE

## 2023-10-15 LAB — RPR: RPR Ser Ql: NONREACTIVE

## 2024-02-13 ENCOUNTER — Ambulatory Visit
Admission: EM | Admit: 2024-02-13 | Discharge: 2024-02-13 | Disposition: A | Discharge status: Home / Self Care | Payer: Self-pay | Attending: Physician Assistant | Admitting: Physician Assistant

## 2024-02-13 ENCOUNTER — Other Ambulatory Visit: Payer: Self-pay

## 2024-02-13 DIAGNOSIS — Z113 Encounter for screening for infections with a predominantly sexual mode of transmission: Secondary | ICD-10-CM | POA: Insufficient documentation

## 2024-02-13 NOTE — ED Triage Notes (Signed)
 Pt present at [facility] for std testing. Pt is also requesting blood work. Denies symptoms.

## 2024-02-13 NOTE — ED Provider Notes (Signed)
 [facility]    CSN: [id number] Arrival date & time: 02/13/24  1046      History   Chief Complaint Chief Complaint  Patient presents with   SEXUALLY TRANSMITTED DISEASE    HPI [name] [name] is a 28 y.o. female.   Patient presents today for STI testing.  She denies any symptoms including abdominal pain, pelvic pain, fever, nausea, vomiting, vaginal discharge, genital lesion.  She denies any specific concerns but would like to complete STI panel.  Denies any recent antibiotics.  She is confident that she is not pregnant.    Past Medical History:  Diagnosis Date   Gall bladder inflammation    GERD (gastroesophageal reflux disease)    Medical history non-contributory    Migraines     Patient Active Problem List   Diagnosis Date Noted   HCAP (healthcare-associated pneumonia) 06/13/2021   Respiratory failure with hypoxia (HCC) 06/13/2021   Normocytic anemia 06/13/2021   Acute CHF (congestive heart failure) (HCC) 06/13/2021   VSD (ventricular septal defect) 01/15/2021   Rubella non-immune status, antepartum 11/16/2020   Obesity in pregnancy 11/16/2020   Morbid obesity with BMI of 50.0-59.9, adult (HCC) 11/16/2020   History of C-section 11/14/2020   Encounter for supervision of normal pregnancy in first trimester 10/14/2020    Past Surgical History:  Procedure Laterality Date   CESAREAN SECTION N/A 09/18/2015   Procedure: CESAREAN SECTION;  Surgeon: Woodrow Hazy, MD;  Location: [facility];  Service: Obstetrics;  Laterality: N/A;   CESAREAN SECTION  06/05/2021   LAPAROSCOPIC GASTRIC BAND REMOVAL WITH LAPAROSCOPIC GASTRIC SLEEVE RESECTION     MOUTH SURGERY     NO PAST SURGERIES      OB History     Gravida  2   Para  1   Term  1   Preterm      AB      Living  1      SAB      IAB      Ectopic      Multiple  0   Live Births  1            Home Medications    Prior to Admission medications   Medication Sig Start Date End Date  Taking? Authorizing Provider  Blood Pressure Monitoring (BLOOD PRESSURE KIT) DEVI 1 kit by Does not apply route once a week. 10/14/20   Constant, Peggy, MD  FEROSUL 325 (65 Fe) MG tablet Take 325 mg by mouth daily.    [provider]  FLUoxetine (PROZAC) 10 MG capsule Take 10 mg by mouth every morning. 06/24/23   [provider]  hydrOXYzine (ATARAX) 10 MG tablet Take by mouth.    [provider]  lamoTRIgine (LAMICTAL) 100 MG tablet Take 100 mg by mouth daily. 03/08/23   [provider]  ursodiol (ACTIGALL) 300 MG capsule Take by mouth. 12/22/22   [provider]    Family History Family History  Problem Relation Age of Onset   Diabetes Maternal Uncle    Hypertension Maternal Uncle    Heart disease Maternal Uncle     Social History Social History   Tobacco Use   Smoking status: Former    Current packs/day: 0.00    Types: Cigarettes, Cigars    Quit date: 01/14/2015    Years since quitting: 9.0   Smokeless tobacco: Never   Tobacco comments:    stopped after confirmed pregnancy  Vaping Use   Vaping status: Never Used  Substance Use Topics   Alcohol use: Yes    Comment: occ   Drug use: No     Allergies   Bee venom and Shellfish allergy   Review of Systems Review of Systems  Constitutional:  Negative for activity change, appetite change, fatigue and fever.  Gastrointestinal:  Negative for abdominal pain, diarrhea, nausea and vomiting.  Genitourinary:  Negative for dysuria, frequency, pelvic pain, urgency, vaginal bleeding, vaginal discharge and vaginal pain.     Physical Exam Triage Vital Signs ED Triage Vitals  Encounter Vitals Group     BP 02/13/24 1057 112/78     Systolic BP Percentile --      Diastolic BP Percentile --      Pulse Rate 02/13/24 1057 84     Resp 02/13/24 1057 18     Temp 02/13/24 1057 98.4 F (36.9 C)     Temp Source 02/13/24 1057 Oral     SpO2 02/13/24 1057 97 %     Weight --      Height --       Head Circumference --      Peak Flow --      Pain Score 02/13/24 1056 0     Pain Loc --      Pain Education --      Exclude from Growth Chart --    No data found.  Updated Vital Signs BP 112/78 (BP Location: Left Arm)   Pulse 84   Temp 98.4 F (36.9 C) (Oral)   Resp 18   LMP 01/25/2024 (Exact Date)   SpO2 97%   Visual Acuity Right Eye Distance:   Left Eye Distance:   Bilateral Distance:    Right Eye Near:   Left Eye Near:    Bilateral Near:     Physical Exam Vitals reviewed.  Constitutional:      General: She is awake. She is not in acute distress.    Appearance: Normal appearance. She is well-developed. She is not ill-appearing.     Comments: Very pleasant female appear stated age in no acute distress sitting comfortably in exam room  HENT:     Head: Normocephalic and atraumatic.  Cardiovascular:     Rate and Rhythm: Normal rate and regular rhythm.     Heart sounds: Normal heart sounds, S1 normal and S2 normal. No murmur heard. Pulmonary:     Effort: Pulmonary effort is normal.     Breath sounds: Normal breath sounds. No wheezing, rhonchi or rales.     Comments: Clear to auscultation bilaterally Abdominal:     General: Bowel sounds are normal.     Palpations: Abdomen is soft.     Tenderness: There is no abdominal tenderness. There is no right CVA tenderness, left CVA tenderness, guarding or rebound.     Comments: Benign abdominal exam  Psychiatric:        Behavior: Behavior is cooperative.      UC Treatments / Results  Labs (all labs ordered are listed, but only abnormal results are displayed) Labs Reviewed  RPR  HIV ANTIBODY (ROUTINE TESTING W REFLEX)  CERVICOVAGINAL ANCILLARY ONLY    EKG   Radiology No results found.  Procedures Procedures (including critical care time)  Medications Ordered in UC Medications - No data to display  Initial Impression / Assessment and Plan / UC Course  I have reviewed the triage vital signs and the nursing  notes.  Pertinent labs & imaging results that were available during my care of the  patient were reviewed by me and considered in my medical decision making (see chart for details).     STI testing obtained today and results are pending.  She denies any symptoms so testing was obtained for gonorrhea, chlamydia, trichomonas via follow-up.  She was tested for HIV and syphilis.  Recommended that she monitor her MyChart for results and we will contact her only if something is positive.  We discussed the importance of safe sex practices.  If she develops any symptoms she is to return for reevaluation.  All questions were answered to patient satisfaction.  Final Clinical Impressions(s) / UC Diagnoses   Final diagnoses:  Screening examination for STI     Discharge Instructions      Monitor your MyChart for results.  We will contact you if anything is positive.  Abstain from sex and to receive results.  If you develop any symptoms including abdominal pain, pelvic pain, vaginal discharge, fever, nausea, vomiting, sores on your vagina you should be seen immediately for reevaluation.  Use a condom with each sexual encounter.    ED Prescriptions   None    PDMP not reviewed this encounter.   Budd Cargo, PA-C 02/13/24 1201

## 2024-02-13 NOTE — Discharge Instructions (Signed)
 Monitor your MyChart for results.  We will contact you if anything is positive.  Abstain from sex and to receive results.  If you develop any symptoms including abdominal pain, pelvic pain, vaginal discharge, fever, nausea, vomiting, sores on your vagina you should be seen immediately for reevaluation.  Use a condom with each sexual encounter.

## 2024-02-14 LAB — CERVICOVAGINAL ANCILLARY ONLY
Chlamydia: NEGATIVE
Comment: NEGATIVE
Comment: NEGATIVE
Comment: NORMAL
Neisseria Gonorrhea: NEGATIVE
Trichomonas: NEGATIVE

## 2024-02-15 LAB — RPR: RPR Ser Ql: NONREACTIVE

## 2024-02-15 LAB — HIV ANTIBODY (ROUTINE TESTING W REFLEX): HIV Screen 4th Generation wRfx: NONREACTIVE

## 2024-05-20 ENCOUNTER — Ambulatory Visit: Payer: Self-pay

## 2024-06-02 ENCOUNTER — Ambulatory Visit: Payer: Self-pay

## 2024-06-02 ENCOUNTER — Ambulatory Visit
Admission: RE | Admit: 2024-06-02 | Discharge: 2024-06-02 | Disposition: A | Discharge status: Home / Self Care | Source: Ambulatory Visit | Attending: Family Medicine | Admitting: Family Medicine

## 2024-06-02 VITALS — BP 118/82 | HR 80 | Temp 98.3°F | Resp 18

## 2024-06-02 DIAGNOSIS — Z113 Encounter for screening for infections with a predominantly sexual mode of transmission: Secondary | ICD-10-CM | POA: Diagnosis not present

## 2024-06-02 DIAGNOSIS — N898 Other specified noninflammatory disorders of vagina: Secondary | ICD-10-CM | POA: Diagnosis not present

## 2024-06-02 NOTE — ED Provider Notes (Signed)
 [facility]    CSN: [id number] Arrival date & time: 06/02/24  1302      History   Chief Complaint Chief Complaint  Patient presents with   SEXUALLY TRANSMITTED DISEASE    Entered by patient    HPI [name] [name] is a 28 y.o. female with a past medical history of GERD and migraines presents for vaginal discharge.  Patient reports 1 week of a non-malodorous vaginal discharge that she describes as clumpy and watery.  She denies any dysuria, fevers, nausea/vomiting, flank pain.  No known STD exposure but she does have a new sexual partner and would like screening.  No OTC treatments have been used for symptoms.  No other concerns at this time.  HPI  Past Medical History:  Diagnosis Date   Gall bladder inflammation    GERD (gastroesophageal reflux disease)    Medical history non-contributory    Migraines     Patient Active Problem List   Diagnosis Date Noted   HCAP (healthcare-associated pneumonia) 06/13/2021   Respiratory failure with hypoxia (HCC) 06/13/2021   Normocytic anemia 06/13/2021   Acute CHF (congestive heart failure) (HCC) 06/13/2021   VSD (ventricular septal defect) 01/15/2021   Rubella non-immune status, antepartum 11/16/2020   Obesity in pregnancy 11/16/2020   Morbid obesity with BMI of 50.0-59.9, adult (HCC) 11/16/2020   History of C-section 11/14/2020   Encounter for supervision of normal pregnancy in first trimester 10/14/2020    Past Surgical History:  Procedure Laterality Date   CESAREAN SECTION N/A 09/18/2015   Procedure: CESAREAN SECTION;  Surgeon: Charlie Croak, MD;  Location: [facility];  Service: Obstetrics;  Laterality: N/A;   CESAREAN SECTION  06/05/2021   LAPAROSCOPIC GASTRIC BAND REMOVAL WITH LAPAROSCOPIC GASTRIC SLEEVE RESECTION     MOUTH SURGERY     NO PAST SURGERIES      OB History     Gravida  2   Para  1   Term  1   Preterm      AB      Living  1      SAB      IAB      Ectopic      Multiple  0   Live  Births  1            Home Medications    Prior to Admission medications   Medication Sig Start Date End Date Taking? Authorizing Provider  Blood Pressure Monitoring (BLOOD PRESSURE KIT) DEVI 1 kit by Does not apply route once a week. 10/14/20   Constant, Peggy, MD  FEROSUL 325 (65 Fe) MG tablet Take 325 mg by mouth daily.    [provider]  FLUoxetine (PROZAC) 10 MG capsule Take 10 mg by mouth every morning. 06/24/23   [provider]  hydrOXYzine (ATARAX) 10 MG tablet Take by mouth.    [provider]  lamoTRIgine (LAMICTAL) 100 MG tablet Take 100 mg by mouth daily. 03/08/23   [provider]  ursodiol (ACTIGALL) 300 MG capsule Take by mouth. 12/22/22   [provider]    Family History Family History  Problem Relation Age of Onset   Diabetes Maternal Uncle    Hypertension Maternal Uncle    Heart disease Maternal Uncle     Social History Social History   Tobacco Use   Smoking status: Former    Current packs/day: 0.00    Types: Cigarettes, Cigars    Quit date: 01/14/2015    Years since quitting:  9.3   Smokeless tobacco: Never   Tobacco comments:    stopped after confirmed pregnancy  Vaping Use   Vaping status: Never Used  Substance Use Topics   Alcohol use: Yes    Comment: occ   Drug use: No     Allergies   Bee venom and Shellfish allergy   Review of Systems Review of Systems  Genitourinary:  Positive for vaginal discharge.     Physical Exam Triage Vital Signs ED Triage Vitals  Encounter Vitals Group     BP 06/02/24 1311 118/82     Girls Systolic BP Percentile --      Girls Diastolic BP Percentile --      Boys Systolic BP Percentile --      Boys Diastolic BP Percentile --      Pulse Rate 06/02/24 1311 80     Resp 06/02/24 1311 18     Temp 06/02/24 1311 98.3 F (36.8 C)     Temp Source 06/02/24 1311 Oral     SpO2 06/02/24 1311 98 %     Weight --      Height --      Head Circumference --      Peak  Flow --      Pain Score 06/02/24 1310 0     Pain Loc --      Pain Education --      Exclude from Growth Chart --    No data found.  Updated Vital Signs BP 118/82 (BP Location: Left Arm)   Pulse 80   Temp 98.3 F (36.8 C) (Oral)   Resp 18   LMP 05/22/2024 (Exact Date)   SpO2 98%   Visual Acuity Right Eye Distance:   Left Eye Distance:   Bilateral Distance:    Right Eye Near:   Left Eye Near:    Bilateral Near:     Physical Exam Vitals and nursing note reviewed.  Constitutional:      Appearance: Normal appearance.  HENT:     Head: Normocephalic and atraumatic.  Eyes:     Pupils: Pupils are equal, round, and reactive to light.  Cardiovascular:     Rate and Rhythm: Normal rate.  Pulmonary:     Effort: Pulmonary effort is normal.  Skin:    General: Skin is warm and dry.  Neurological:     General: No focal deficit present.     Mental Status: She is alert and oriented to person, place, and time.  Psychiatric:        Mood and Affect: Mood normal.        Behavior: Behavior normal.      UC Treatments / Results  Labs (all labs ordered are listed, but only abnormal results are displayed) Labs Reviewed  RPR  HIV ANTIBODY (ROUTINE TESTING W REFLEX)  CERVICOVAGINAL ANCILLARY ONLY    EKG   Radiology No results found.  Procedures Procedures (including critical care time)  Medications Ordered in UC Medications - No data to display  Initial Impression / Assessment and Plan / UC Course  I have reviewed the triage vital signs and the nursing notes.  Pertinent labs & imaging results that were available during my care of the patient were reviewed by me and considered in my medical decision making (see chart for details).     Reviewed exam and symptoms with patient.  No red flags.  Vaginal swab/STD testing as ordered will contact for any positive results.  Will await results prior to  initiating any treatment.  PCP or GYN follow-up if symptoms do not improve.  ER  precautions reviewed. Final Clinical Impressions(s) / UC Diagnoses   Final diagnoses:  Screening examination for STD (sexually transmitted disease)  Vaginal discharge     Discharge Instructions      The clinic will contact you with results of the testing done today if positive.  Please follow-up with your PCP or gynecologist if your symptoms do not improve.  Please go to the ER for any worsening symptoms.  Hope you feel better soon!    ED Prescriptions   None    PDMP not reviewed this encounter.   Loreda Myla SAUNDERS, NP 06/02/24 1323

## 2024-06-02 NOTE — ED Triage Notes (Signed)
 Pt present for STD testing. Pt c/o vaginal discharge and smell x one week. Pt states she has had a new partner. Denies abdominal and back pain. Pt denies urinary symptoms.

## 2024-06-02 NOTE — Discharge Instructions (Addendum)
 The clinic will contact you with results of the testing done today if positive.  Please follow-up with your PCP or gynecologist if your symptoms do not improve.  Please go to the ER for any worsening symptoms.  Hope you feel better soon!

## 2024-06-03 LAB — HIV ANTIBODY (ROUTINE TESTING W REFLEX): HIV Screen 4th Generation wRfx: NONREACTIVE

## 2024-06-03 LAB — RPR: RPR Ser Ql: NONREACTIVE

## 2024-06-05 ENCOUNTER — Ambulatory Visit: Payer: Self-pay

## 2024-06-05 LAB — CERVICOVAGINAL ANCILLARY ONLY
Bacterial Vaginitis (gardnerella): POSITIVE — AB
Candida Glabrata: NEGATIVE
Candida Vaginitis: NEGATIVE
Chlamydia: NEGATIVE
Comment: NEGATIVE
Comment: NEGATIVE
Comment: NEGATIVE
Comment: NEGATIVE
Comment: NEGATIVE
Comment: NORMAL
Neisseria Gonorrhea: NEGATIVE
Trichomonas: POSITIVE — AB

## 2024-06-05 MED ORDER — METRONIDAZOLE 500 MG PO TABS: 500.0000 mg | ORAL_TABLET | Freq: Two times a day (BID) | ORAL | 0 refills | Status: AC

## 2024-07-09 ENCOUNTER — Ambulatory Visit
Admission: EM | Admit: 2024-07-09 | Discharge: 2024-07-09 | Disposition: A | Discharge status: Home / Self Care | Attending: Family Medicine | Admitting: Family Medicine

## 2024-07-09 DIAGNOSIS — J00 Acute nasopharyngitis [common cold]: Secondary | ICD-10-CM

## 2024-07-09 DIAGNOSIS — J309 Allergic rhinitis, unspecified: Secondary | ICD-10-CM | POA: Diagnosis not present

## 2024-07-09 DIAGNOSIS — Z91013 Allergy to seafood: Secondary | ICD-10-CM

## 2024-07-09 MED ORDER — PREDNISONE 20 MG PO TABS: ORAL_TABLET | ORAL | 0 refills | Status: AC

## 2024-07-09 NOTE — ED Provider Notes (Signed)
 [facility]  Note:  This document was prepared using Conservation officer, historic buildings and may include unintentional dictation errors.  MRN: [id number] DOB: 13-Dec-1995  Subjective:   [name] [name] is a 28 y.o. female presenting for 3-day history of persistent sinus headaches, sinus pressure, sinus drainage.  Symptoms started from having an allergic reaction to exposure to shellfish at a restaurant.  Patient immediately had throat closing sensation, swelling.  She took Benadryl  and helped significantly.  Has used pseudoephedrine consistently since then.  Unfortunately, she continues to have lingering sinus symptoms.  Has felt achy and has had a fever.  No active chest pain, shortness of breath, wheezing.  Has a history of acute congestive heart failure.  EF was 60-65% 06/13/2021.  No asthma.  No smoking of any kind including cigarettes, cigars, vaping, marijuana use.    No current facility-administered medications for this encounter.  Current Outpatient Medications:    diphenhydrAMINE  (BENADRYL ) 50 MG tablet, Take 50 mg by mouth at bedtime as needed for itching., Disp: , Rfl:    pseudoephedrine (SUDAFED) 120 MG 12 hr tablet, Take 120 mg by mouth 2 (two) times daily., Disp: , Rfl:    Blood Pressure Monitoring (BLOOD PRESSURE KIT) DEVI, 1 kit by Does not apply route once a week., Disp: 1 each, Rfl: 0   FEROSUL 325 (65 Fe) MG tablet, Take 325 mg by mouth daily., Disp: , Rfl:    FLUoxetine (PROZAC) 10 MG capsule, Take 10 mg by mouth every morning., Disp: , Rfl:    hydrOXYzine (ATARAX) 10 MG tablet, Take by mouth., Disp: , Rfl:    lamoTRIgine (LAMICTAL) 100 MG tablet, Take 100 mg by mouth daily., Disp: , Rfl:    ursodiol (ACTIGALL) 300 MG capsule, Take by mouth., Disp: , Rfl:    Allergies  Allergen Reactions   Bee Venom Other (See Comments)    Unknown childhood reaction   Shellfish Allergy Hives and Swelling    Throat swelling    Past Medical History:  Diagnosis  Date   Gall bladder inflammation    GERD (gastroesophageal reflux disease)    Medical history non-contributory    Migraines      Past Surgical History:  Procedure Laterality Date   CESAREAN SECTION N/A 09/18/2015   Procedure: CESAREAN SECTION;  Surgeon: Charlie Croak, MD;  Location: [facility];  Service: Obstetrics;  Laterality: N/A;   CESAREAN SECTION  06/05/2021   LAPAROSCOPIC GASTRIC BAND REMOVAL WITH LAPAROSCOPIC GASTRIC SLEEVE RESECTION     MOUTH SURGERY     NO PAST SURGERIES      Family History  Problem Relation Age of Onset   Diabetes Maternal Uncle    Hypertension Maternal Uncle    Heart disease Maternal Uncle     Social History   Tobacco Use   Smoking status: Former    Current packs/day: 0.00    Types: Cigarettes, Cigars    Quit date: 01/14/2015    Years since quitting: 9.4   Smokeless tobacco: Never   Tobacco comments:    stopped after confirmed pregnancy  Vaping Use   Vaping status: Never Used  Substance Use Topics   Alcohol use: Yes    Comment: occ   Drug use: No    ROS   Objective:   Vitals: BP 124/89 (BP Location: Right Arm)   Pulse 79   Temp 98.9 F (37.2 C) (Oral)   Resp 18   LMP 06/21/2024 (Exact Date)   SpO2 99%  Physical Exam Constitutional:      General: She is not in acute distress.    Appearance: Normal appearance. She is well-developed and normal weight. She is not ill-appearing, toxic-appearing or diaphoretic.  HENT:     Head: Normocephalic and atraumatic.     Right Ear: Tympanic membrane, ear canal and external ear normal. No drainage or tenderness. No middle ear effusion. There is no impacted cerumen. Tympanic membrane is not erythematous or bulging.     Left Ear: Tympanic membrane, ear canal and external ear normal. No drainage or tenderness.  No middle ear effusion. There is no impacted cerumen. Tympanic membrane is not erythematous or bulging.     Nose: Nose normal. No congestion or rhinorrhea.     Mouth/Throat:     Mouth:  Mucous membranes are moist. No oral lesions.     Pharynx: No pharyngeal swelling, oropharyngeal exudate, posterior oropharyngeal erythema or uvula swelling.     Tonsils: No tonsillar exudate or tonsillar abscesses. 0 on the right. 0 on the left.  Eyes:     General: No scleral icterus.       Right eye: No discharge.        Left eye: No discharge.     Extraocular Movements: Extraocular movements intact.     Right eye: Normal extraocular motion.     Left eye: Normal extraocular motion.     Conjunctiva/sclera: Conjunctivae normal.  Neck:     Meningeal: Brudzinski's sign and Kernig's sign absent.  Cardiovascular:     Rate and Rhythm: Normal rate and regular rhythm.     Heart sounds: Normal heart sounds. No murmur heard.    No friction rub. No gallop.  Pulmonary:     Effort: Pulmonary effort is normal. No respiratory distress.     Breath sounds: No stridor. No wheezing, rhonchi or rales.  Chest:     Chest wall: No tenderness.  Musculoskeletal:     Cervical back: Normal range of motion and neck supple.  Lymphadenopathy:     Cervical: No cervical adenopathy.  Skin:    General: Skin is warm and dry.  Neurological:     General: No focal deficit present.     Mental Status: She is alert and oriented to person, place, and time.     Cranial Nerves: No cranial nerve deficit, dysarthria or facial asymmetry.     Motor: No weakness or pronator drift.     Coordination: Romberg sign negative. Coordination normal. Finger-Nose-Finger Test and Heel to [facility] Test normal. Rapid alternating movements normal.     Gait: Gait and tandem walk normal.     Deep Tendon Reflexes: Reflexes normal.  Psychiatric:        Mood and Affect: Mood normal.        Behavior: Behavior normal.        Thought Content: Thought content normal.        Judgment: Judgment normal.      Assessment and Plan :   PDMP not reviewed this encounter.  1. Allergic rhinitis, unspecified seasonality, unspecified trigger   2. Acute  rhinitis   3. Shellfish allergy    Patient refused COVID testing.  Advised using prednisone  for possible lingering reactivity from a shellfish exposure and/or allergic rhinitis, acute rhinitis. Deferred imaging given clear cardiopulmonary exam, hemodynamically stable vital signs.  Counseled patient on potential for adverse effects with medications prescribed/recommended today, ER and return-to-clinic precautions discussed, patient verbalized understanding.    Christopher Savannah, [place] 07/09/24 1530

## 2024-07-09 NOTE — ED Triage Notes (Addendum)
 Pt reports headache and sinus pressure x 3 days after she had an allergic reaction at the restaurant. Pt took Benadryl  the night of the allergic reaction, and Sudafed gives some relief with sinus pressure.   Pt do not want COVID test.

## 2024-09-12 ENCOUNTER — Institutional Professional Consult (permissible substitution): Admitting: Plastic Surgery
